# Patient Record
Sex: Male | Born: 1985 | Race: White | Hispanic: No | Marital: Single | Smoking: Current every day smoker
Health system: Southern US, Community
[De-identification: ages and names within clinical notes are randomized; demographics above are authoritative.]

## PROBLEM LIST (undated history)

## (undated) DIAGNOSIS — I10 Essential (primary) hypertension: Secondary | ICD-10-CM

## (undated) DIAGNOSIS — F419 Anxiety disorder, unspecified: Secondary | ICD-10-CM

## (undated) DIAGNOSIS — M542 Cervicalgia: Secondary | ICD-10-CM

## (undated) DIAGNOSIS — M1990 Unspecified osteoarthritis, unspecified site: Secondary | ICD-10-CM

## (undated) DIAGNOSIS — F32A Depression, unspecified: Secondary | ICD-10-CM

## (undated) DIAGNOSIS — I509 Heart failure, unspecified: Secondary | ICD-10-CM

## (undated) DIAGNOSIS — N289 Disorder of kidney and ureter, unspecified: Secondary | ICD-10-CM

## (undated) DIAGNOSIS — F329 Major depressive disorder, single episode, unspecified: Secondary | ICD-10-CM

## (undated) DIAGNOSIS — G43909 Migraine, unspecified, not intractable, without status migrainosus: Secondary | ICD-10-CM

## (undated) DIAGNOSIS — K219 Gastro-esophageal reflux disease without esophagitis: Secondary | ICD-10-CM

## (undated) HISTORY — PX: WISDOM TOOTH EXTRACTION: SHX21

## (undated) HISTORY — PX: APPENDECTOMY: SHX54

---

## 2010-11-02 ENCOUNTER — Emergency Department (HOSPITAL_COMMUNITY): Admission: EM | Admit: 2010-11-02 | Discharge: 2010-03-20 | Payer: Self-pay | Admitting: Emergency Medicine

## 2011-02-13 LAB — COMPREHENSIVE METABOLIC PANEL WITH GFR
AST: 23 U/L (ref 0–37)
Albumin: 3.9 g/dL (ref 3.5–5.2)
Alkaline Phosphatase: 70 U/L (ref 39–117)
Calcium: 8.8 mg/dL (ref 8.4–10.5)
GFR calc non Af Amer: >60 mL/min (ref >60)
Glucose, Bld: 106 mg/dL — ABNORMAL HIGH (ref 70–99)
Total Bilirubin: 0.5 mg/dL (ref 0.3–1.2)
Total Protein: 7 g/dL (ref 6.0–8.3)

## 2011-02-13 LAB — URINALYSIS, ROUTINE W REFLEX MICROSCOPIC
Bilirubin Urine: NEGATIVE
Glucose, UA: NEGATIVE mg/dL
Ketones, ur: NEGATIVE mg/dL
Leukocytes, UA: NEGATIVE
Nitrite: NEGATIVE
Protein, ur: NEGATIVE mg/dL
Specific Gravity, Urine: 1.01 (ref 1.005–1.030)
Urobilinogen, UA: 0.2 mg/dL (ref 0.0–1.0)
pH: 6 (ref 5.0–8.0)

## 2011-02-13 LAB — CBC
HCT: 44.9 % (ref 39.0–52.0)
Hemoglobin: 15.5 g/dL (ref 13.0–17.0)
MCHC: 34.5 g/dL (ref 30.0–36.0)
MCV: 91.2 fL (ref 78.0–100.0)
Platelets: 237 10*3/uL (ref 150–400)
RBC: 4.93 MIL/uL (ref 4.22–5.81)
RDW: 13 % (ref 11.5–15.5)
WBC: 8.2 K/uL (ref 4.0–10.5)

## 2011-02-13 LAB — COMPREHENSIVE METABOLIC PANEL
ALT: 24 U/L (ref 0–53)
BUN: 11 mg/dL (ref 6–23)
CO2: 24 mEq/L (ref 19–32)
Chloride: 107 mEq/L (ref 96–112)
Creatinine, Ser: 0.74 mg/dL (ref 0.4–1.5)
GFR calc Af Amer: >60 mL/min (ref >60)
Potassium: 3.3 mEq/L — ABNORMAL LOW (ref 3.5–5.1)
Sodium: 139 mEq/L (ref 135–145)

## 2011-02-13 LAB — LIPASE, BLOOD: Lipase: 22 U/L (ref 11–59)

## 2011-02-13 LAB — DIFFERENTIAL
Basophils Absolute: 0 10*3/uL (ref 0.0–0.1)
Basophils Relative: 0 % (ref 0–1)
Eosinophils Absolute: 0.2 10*3/uL (ref 0.0–0.7)
Eosinophils Relative: 3 % (ref 0–5)
Lymphocytes Relative: 32 % (ref 12–46)
Lymphs Abs: 2.6 10*3/uL (ref 0.7–4.0)
Monocytes Absolute: 0.7 10*3/uL (ref 0.1–1.0)
Monocytes Relative: 8 % (ref 3–12)
Neutro Abs: 4.7 10*3/uL (ref 1.7–7.7)
Neutrophils Relative %: 57 % (ref 43–77)

## 2011-02-13 LAB — URINE MICROSCOPIC-ADD ON

## 2011-02-13 LAB — MONONUCLEOSIS SCREEN: Mono Screen: NEGATIVE

## 2011-09-05 ENCOUNTER — Encounter (HOSPITAL_COMMUNITY): Payer: Self-pay | Admitting: Radiology

## 2011-09-05 ENCOUNTER — Inpatient Hospital Stay (HOSPITAL_COMMUNITY)
Admission: EM | Admit: 2011-09-05 | Discharge: 2011-09-05 | DRG: 343 | Disposition: A | Discharge status: Home / Self Care | Payer: Self-pay | Attending: Surgery | Admitting: Surgery

## 2011-09-05 ENCOUNTER — Other Ambulatory Visit (INDEPENDENT_AMBULATORY_CARE_PROVIDER_SITE_OTHER): Payer: Self-pay | Admitting: General Surgery

## 2011-09-05 ENCOUNTER — Emergency Department (HOSPITAL_COMMUNITY): Payer: Self-pay

## 2011-09-05 DIAGNOSIS — K3580 Unspecified acute appendicitis: Principal | ICD-10-CM | POA: Diagnosis present

## 2011-09-05 DIAGNOSIS — F17200 Nicotine dependence, unspecified, uncomplicated: Secondary | ICD-10-CM | POA: Diagnosis present

## 2011-09-05 DIAGNOSIS — R1031 Right lower quadrant pain: Secondary | ICD-10-CM

## 2011-09-05 DIAGNOSIS — R109 Unspecified abdominal pain: Secondary | ICD-10-CM | POA: Diagnosis present

## 2011-09-05 HISTORY — DX: Gastro-esophageal reflux disease without esophagitis: K21.9

## 2011-09-05 LAB — LIPASE, BLOOD: Lipase: 9 U/L — ABNORMAL LOW (ref 11–59)

## 2011-09-05 LAB — DIFFERENTIAL
Basophils Absolute: 0 K/uL (ref 0.0–0.1)
Basophils Relative: 0 % (ref 0–1)
Eosinophils Absolute: 0.1 K/uL (ref 0.0–0.7)
Eosinophils Relative: 1 % (ref 0–5)
Lymphocytes Relative: 7 % — ABNORMAL LOW (ref 12–46)
Lymphs Abs: 1.5 K/uL (ref 0.7–4.0)
Monocytes Absolute: 1.3 K/uL — ABNORMAL HIGH (ref 0.1–1.0)
Monocytes Relative: 7 % (ref 3–12)
Neutro Abs: 17.1 K/uL — ABNORMAL HIGH (ref 1.7–7.7)
Neutrophils Relative %: 85 % — ABNORMAL HIGH (ref 43–77)

## 2011-09-05 LAB — COMPREHENSIVE METABOLIC PANEL
ALT: 36 U/L (ref 0–53)
AST: 25 U/L (ref 0–37)
Albumin: 4.2 g/dL (ref 3.5–5.2)
Calcium: 10.2 mg/dL (ref 8.4–10.5)
Chloride: 102 mEq/L (ref 96–112)
Creatinine, Ser: 0.68 mg/dL (ref 0.50–1.35)
Glucose, Bld: 134 mg/dL — ABNORMAL HIGH (ref 70–99)
Potassium: 3.6 mEq/L (ref 3.5–5.1)
Total Bilirubin: 0.6 mg/dL (ref 0.3–1.2)
Total Protein: 7.6 g/dL (ref 6.0–8.3)

## 2011-09-05 LAB — CBC
HCT: 44.1 % (ref 39.0–52.0)
Hemoglobin: 16 g/dL (ref 13.0–17.0)
MCH: 31.3 pg (ref 26.0–34.0)
MCHC: 36.3 g/dL — ABNORMAL HIGH (ref 30.0–36.0)
MCV: 86.3 fL (ref 78.0–100.0)
Platelets: 257 K/uL (ref 150–400)
RBC: 5.11 MIL/uL (ref 4.22–5.81)
RDW: 12.5 % (ref 11.5–15.5)
WBC: 20 K/uL — ABNORMAL HIGH (ref 4.0–10.5)

## 2011-09-05 LAB — ETHANOL: Alcohol, Ethyl (B): <11 mg/dL (ref 0–11)

## 2011-09-12 ENCOUNTER — Encounter (INDEPENDENT_AMBULATORY_CARE_PROVIDER_SITE_OTHER): Payer: Self-pay

## 2011-09-12 NOTE — Op Note (Signed)
NAME:  Erik, [name] NO.:  [id number]  MEDICAL RECORD NO.:  [id number]  LOCATION:  WLED                         FACILITY:  [facility]  PHYSICIAN:  Erik [name], Erik [name].DATE OF BIRTH:  [birth year]  DATE OF PROCEDURE:  09/05/2011 DATE OF DISCHARGE:                              OPERATIVE REPORT   PREOPERATIVE DIAGNOSIS:  Acute appendicitis.  OPERATION:  Laparoscopic appendectomy.  ANESTHESIA:  General anesthesia, local supplementation.  SURGEON:  Erik [name], Erik [name].  ASSISTANT:  Nurse.  HISTORY:  Erik [name] is a 25 year old Caucasian male who presented to the emergency room early a.m. with the following history.  He states for the last couple of days he has not been feeling well, he stayed out of work yesterday and then last night, his wife is pregnant, they had a light dinner and then he started having pretty significant epigastric pain.  It kind of shifted to his lower abdomen.  In the early a.m., he presented to the emergency room here at [facility] and was seen by the ER physician, was definitely tender in the lower abdomen, originally kind of epigastric and they had done a chest x-ray thinking that he was complaining of so much pain in the upper abdomen, this was unremarkable. Then they did a CT, the CT showed a markedly inflamed appendix in the typical right lower quadrant location.  His white count was 20,000 and this morning approximately at 8  or 7:45, we were called after the CT was read.  On questioning, the patient stated that he has not felt well for about 2-3 days and then the pain really became more intense late yesterday afternoon and the evening.  Preoperatively, he was given 3 g of Unasyn.  He has got PAS stockings and he was taken back to the operative suite.  Induction of general anesthesia by Dr. Okey Dupre, anesthesiologist.  Foley catheter was inserted sterilely after he was intubated.  I had clipped the hair around the  umbilicus in the left lower quadrant area and then prepped him with Betadine solution and draped in a sterile manner.  A time-out was completed.  The antibiotics had been received and a small incision was made below the umbilicus.  He is a Games developer, quite muscular and the fascia I could pick up between hemostats with the underlying peritoneum was kind of pushing away and I was finally able to pull apart enough that I could take my finger and kind of go through the peritoneum.  A pursestring suture had been placed and then the Hasson cannula introduced.  He gets the inflammatory process in the right lower quadrant.  Fortunately, there was no evidence of any rupture.  A 10-11 trocar was placed in the left lower quadrant and then a 5-mm trocar was placed a little above the umbilicus on the right side.  I could then, with the camera in the left lower quadrant, kind of visualize after some light adhesions had been taken out anteriorly.  I could see the markedly inflamed appendix that was cored.  I could grab the mesentery to the appendix and then elevated this and then using the Harmonic scalpel could kind of clamp the  little vesicles and cauterize them.  This was freed so that the mesentery to the appendix had been completely freed up right down to the base of the appendix with the cecal junction.  I was working sometimes with me holding the appendix in the right upper quadrant and then kind of shift for the better exposure.  The stapler linear with a white load was then used to put across the base of the appendix cecum and after I was satisfied with the position it was fired with good hemostasis.  The appendix was then placed in the EndoCatch bag and then the bag containing the appendix withdrawn at the umbilicus.  Next, I reinserted the cannula, kind of looked at the area, washed and irrigated.  There was good hemostasis.  No evidence of any problems.  The irrigating fluid was  aspirated.  Then, since he is so muscular, I put an additional figure-of-eight in the fascia below the umbilicus and tied both it and the pursestring under direct vision looking at it through the camera.  I then removed the 5-mm port and withdrew the left lower quadrant under direct vision.  I could not actually identify the fascia and the left lower quadrant was completely stitched in the anterior fascia, I could not visualize it well enough.  The subcutaneous wounds were closed with 4-0 Monocryl and then benzoin and Steri-Strips on the skin.  I had anesthetized the fascia at the umbilicus with Marcaine and the other port sites were anesthetized as they were being placed.  The patient tolerated the procedure nicely.  I am going to give him another dose of Unasyn since his white count was 20,000 and keep him this afternoon.  He possibly can be discharged later today.     Erik [name], Erik [name].     WJW/MEDQ  D:  09/05/2011  T:  09/05/2011  Job:  [id number]  Electronically Signed by Erik [name]. on 09/12/2011 08:51:38 AM

## 2011-09-12 NOTE — H&P (Signed)
  NAME:  Erik [name], Erik [name] NO.:  [id number]  MEDICAL RECORD NO.:  [id number]  LOCATION:  1530                         FACILITY:  [facility]  PHYSICIAN:  Anselm Pancoast. Quashawn Jewkes, M.D.DATE OF BIRTH:  15-Aug-1986  DATE OF ADMISSION:  09/05/2011 DATE OF DISCHARGE:                             HISTORY & PHYSICAL   CHIEF COMPLAINT:  Abdominal pain and acute appendicitis.  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  He and his girlfriend are expecting their first child, and he stated he has not felt well for the last couple of days, not really localizing and then yesterday after evening, late they had a light meal and then he started having more severe pain originally, kind of more in the upper abdomen, but then, after he was in the ER, shifted into the lower abdomen.  The ER physicians first did a chest x-ray and lab studies.  His white count was 20,000.  The chest x-ray and abdominal films are unremarkable.  Then they obtained a CT.  He has got a markedly inflamed appendix in the right lower quadrant, a typical location and we were called at about 8:00 am to see him.  He had not received any antibiotics.  While in the ER, 3 g of Unasyn was ordered.  The patient has been a light smoker for the last year.  He works as a Games developer.  He denies any allergies, and I do not think he has had any previous surgeries.  FAMILY HISTORY:  Noncontributory.  PHYSICAL EXAMINATION:  VITAL SIGNS:  In the emergency room, he had an elevated pulse, but his blood pressure is fine at 131/99, and respirations 22. GENERAL:  He had received some pain medication and was not complaining as much pain when I visualized him.  On listening to his symptoms, it was certainly compatible with appendicitis of probably about a 48 hours total duration. CHEST:  He had good breath sounds bilaterally, was kind of a sinus tachycardia originally, but after the pain medicine given,  it was less than 100. ABDOMEN:  Definitely tender in the right lower quadrant with muscle guarding and rebound, localized to the right lower quadrant. RECTAL:  Did not do a rectal examination on him. CNS:  Physiologic. SKIN:  Unremarkable.  The patient did not have not have an elevated temperature in the emergency room that I could see.  ADMISSION IMPRESSION:  Acute appendicitis, hopefully non-ruptured.  PLAN:  We will plan with a laparoscopic appendectomy.  Note, I had done a laparoscopic appendectomy and a left ovarian cyst on the patient's wife or girlfriend within the last few years.     Anselm Pancoast. Zachery Dakins, M.D.     WJW/MEDQ  D:  09/05/2011  T:  09/05/2011  Job:  [id number]  Electronically Signed by Consuello Bossier M.D. on 09/12/2011 08:51:33 AM

## 2011-09-13 ENCOUNTER — Ambulatory Visit (INDEPENDENT_AMBULATORY_CARE_PROVIDER_SITE_OTHER): Payer: Self-pay | Admitting: General Surgery

## 2011-09-13 ENCOUNTER — Encounter (INDEPENDENT_AMBULATORY_CARE_PROVIDER_SITE_OTHER): Payer: Self-pay | Admitting: General Surgery

## 2011-09-13 VITALS — BP 132/90 | HR 60 | Temp 97.4°F | Resp 20 | Ht 73.0 in | Wt 236.5 lb

## 2011-09-13 DIAGNOSIS — K37 Unspecified appendicitis: Secondary | ICD-10-CM

## 2011-09-13 NOTE — Patient Instructions (Signed)
Return to work Monday and tried to limit lifting to approximately 40 pounds for 2 weeks. See [place] on a p.r.n. basis

## 2011-09-13 NOTE — Progress Notes (Signed)
Subjective:     Patient ID: Erik [name], male   DOB: [birth year], 25 y.o.   MRN: [id number]  HPIPatient is a moderately overweight young male who had acute appendicitis proximally 8 or 9 days ago and underwent a laparoscopic appendectomy [facility]. He was discharged the following day he states that he is continuing to improve but has not returned work. 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 Actually perform a appendectomy on his wife a couple years ago.   Review of Systems No current outpatient prescriptions on file.       Objective:   Physical ExamBP 132/90  Pulse 60  Temp 97.4 F (36.3 C)  Resp 20  Ht 6\' 1"  (1.854 m)  Wt 236 lb 8 oz (107.276 kg)  BMI 31.20 kg/m2 The patient's abdomen is soft it is Dr. well-healed appendectomy incision one below the navel one in the left lower quadrant with small in the right upper quadrant that showed no evidence of any inflammation. He had stated that he had a little bit of serous drainage from the navel several days ago but there is no redness in with his size I'm not surprised that was not more serous drainage. States his bowels are working nicely and is not having any fever. He's not required any pain medication at this time and I think it would be fine to return to work next Monday for trial left to restrict his list into approximately 40 pounds for 2 weeks.    Assessment:     Satisfactory postoperative course following acute appendicitis treated with laparoscopic appendectomy    Plan:     Return prn

## 2011-09-24 NOTE — Discharge Summary (Signed)
  NAME:  Erik, [name] NO.:  [id number]  MEDICAL RECORD NO.:  [id number]  LOCATION:  1530                         FACILITY:  [facility]  PHYSICIAN:  Anselm Pancoast. Tomeshia Pizzi, M.D.DATE OF BIRTH:  1986-01-27  DATE OF ADMISSION:  09/05/2011 DATE OF DISCHARGE:  09/05/2011                              DISCHARGE SUMMARY   ADMISSION DIAGNOSIS:  Acute appendicitis.  DISCHARGE DIAGNOSIS:  Acute appendicitis.  PROCEDURES:  Laparoscopic appendectomy, September 05, 2011, Dr. Consuello Bossier.  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  He and his girlfriend are expecting their 1st child.  He has not felt well for couple of days and eventually presented to the ER for evaluation.  His white count was 20,000.  Chest x-ray and abdominal films were unremarkable.  CT showed a markedly inflamed appendix in the right lower quadrant at typical location.  He was seen in the ER at 8 a.m.  He was treated with 3 g of IV Unasyn and taken to the OR later that morning.  For further history and physical, please see the dictated note.  HOSPITAL COURSE:  The patient was admitted and underwent laparoscopic appendectomy, September 05, 2011.  He tolerated the procedure well.  He returned to the postanesthesia care unit in satisfactory condition and then to the floor.  He is able to void.  He was extremely anxious and apparently, he takes Xanax at home for anxiety.  He received a dose of Unasyn at 2 p.m.  He was seen later that afternoon.  He calmed down and at that point, it was his opinion, he would like to go home.  Dr. Zachery Dakins agreed with plan.  He was discharged home.  He was given plain Tylenol 500 mg p.r.n. for pain and Percocet 1 to 2 q.4 h. p.r.n. for pain.  He will follow up with Dr. Zachery Dakins in 1 week and was given instructions to call.  He was instructed he may shower, liquids today, and advance diet tomorrow.  CONDITION ON DISCHARGE:   Improved.     Eber Hong, P.A.   ______________________________ Anselm Pancoast. Zachery Dakins, M.D.    WDJ/MEDQ  D:  09/16/2011  T:  09/16/2011  Job:  [id number]  Electronically Signed by Sherrie George P.A. on 09/20/2011 08:23:37 PM Electronically Signed by Consuello Bossier M.D. on 09/24/2011 11:24:18 AM

## 2012-07-01 ENCOUNTER — Emergency Department (HOSPITAL_COMMUNITY)
Admission: EM | Admit: 2012-07-01 | Discharge: 2012-07-01 | Disposition: A | Discharge status: Home / Self Care | Payer: Self-pay | Attending: Emergency Medicine | Admitting: Emergency Medicine

## 2012-07-01 ENCOUNTER — Emergency Department (HOSPITAL_COMMUNITY): Payer: Self-pay

## 2012-07-01 ENCOUNTER — Encounter (HOSPITAL_COMMUNITY): Payer: Self-pay | Admitting: *Deleted

## 2012-07-01 DIAGNOSIS — S060XAA Concussion with loss of consciousness status unknown, initial encounter: Secondary | ICD-10-CM | POA: Insufficient documentation

## 2012-07-01 DIAGNOSIS — Y93H3 Activity, building and construction: Secondary | ICD-10-CM | POA: Insufficient documentation

## 2012-07-01 DIAGNOSIS — S060X9A Concussion with loss of consciousness of unspecified duration, initial encounter: Secondary | ICD-10-CM | POA: Insufficient documentation

## 2012-07-01 DIAGNOSIS — K219 Gastro-esophageal reflux disease without esophagitis: Secondary | ICD-10-CM | POA: Insufficient documentation

## 2012-07-01 DIAGNOSIS — Z9089 Acquired absence of other organs: Secondary | ICD-10-CM | POA: Insufficient documentation

## 2012-07-01 DIAGNOSIS — S139XXA Sprain of joints and ligaments of unspecified parts of neck, initial encounter: Secondary | ICD-10-CM | POA: Insufficient documentation

## 2012-07-01 DIAGNOSIS — F17200 Nicotine dependence, unspecified, uncomplicated: Secondary | ICD-10-CM | POA: Insufficient documentation

## 2012-07-01 DIAGNOSIS — M542 Cervicalgia: Secondary | ICD-10-CM | POA: Insufficient documentation

## 2012-07-01 DIAGNOSIS — W208XXA Other cause of strike by thrown, projected or falling object, initial encounter: Secondary | ICD-10-CM | POA: Insufficient documentation

## 2012-07-01 DIAGNOSIS — S161XXA Strain of muscle, fascia and tendon at neck level, initial encounter: Secondary | ICD-10-CM

## 2012-07-01 HISTORY — DX: Cervicalgia: M54.2

## 2012-07-01 MED ORDER — OXYCODONE-ACETAMINOPHEN 5-325 MG PO TABS
2.0000 | ORAL_TABLET | Freq: Once | ORAL | Status: AC
  Administered 2012-07-01: 2 via ORAL
  Filled 2012-07-01: qty 2
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
## 2012-07-03 ENCOUNTER — Emergency Department (HOSPITAL_COMMUNITY)
Admission: EM | Admit: 2012-07-03 | Discharge: 2012-07-03 | Disposition: A | Discharge status: Home / Self Care | Payer: Self-pay | Attending: Emergency Medicine | Admitting: Emergency Medicine

## 2012-07-03 ENCOUNTER — Encounter (HOSPITAL_COMMUNITY): Payer: Self-pay

## 2012-07-03 DIAGNOSIS — F17200 Nicotine dependence, unspecified, uncomplicated: Secondary | ICD-10-CM | POA: Insufficient documentation

## 2012-07-03 DIAGNOSIS — M542 Cervicalgia: Secondary | ICD-10-CM | POA: Insufficient documentation

## 2012-07-03 DIAGNOSIS — K219 Gastro-esophageal reflux disease without esophagitis: Secondary | ICD-10-CM | POA: Insufficient documentation

## 2012-07-03 MED ORDER — OXYCODONE-ACETAMINOPHEN 5-325 MG PO TABS
2.0000 | ORAL_TABLET | Freq: Once | ORAL | Status: AC
  Administered 2012-07-03: 2 via ORAL
  Filled 2012-07-03: qty 2

## 2012-07-03 MED ORDER — OXYCODONE-ACETAMINOPHEN 5-325 MG PO TABS: 1.0000 | ORAL_TABLET | ORAL | Status: AC | PRN

## 2012-07-03 MED ORDER — DIAZEPAM 10 MG PO TABS: 10.0000 mg | ORAL_TABLET | Freq: Four times a day (QID) | ORAL | Status: AC | PRN

## 2012-07-03 MED ORDER — DIAZEPAM 5 MG PO TABS
10.0000 mg | ORAL_TABLET | Freq: Once | ORAL | Status: AC
  Administered 2012-07-03: 10 mg via ORAL
  Filled 2012-07-03: qty 2
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
## 2014-02-25 ENCOUNTER — Emergency Department (HOSPITAL_COMMUNITY)
Admission: EM | Admit: 2014-02-25 | Discharge: 2014-02-25 | Discharge status: Against Medical Advice | Payer: Medicaid Other | Attending: Emergency Medicine | Admitting: Emergency Medicine

## 2014-02-25 ENCOUNTER — Encounter (HOSPITAL_COMMUNITY): Payer: Self-pay | Admitting: Emergency Medicine

## 2014-02-25 DIAGNOSIS — F17200 Nicotine dependence, unspecified, uncomplicated: Secondary | ICD-10-CM | POA: Insufficient documentation

## 2014-02-25 DIAGNOSIS — R42 Dizziness and giddiness: Secondary | ICD-10-CM | POA: Insufficient documentation

## 2014-02-25 DIAGNOSIS — Z8679 Personal history of other diseases of the circulatory system: Secondary | ICD-10-CM | POA: Insufficient documentation

## 2014-02-25 DIAGNOSIS — M542 Cervicalgia: Secondary | ICD-10-CM | POA: Insufficient documentation

## 2014-02-25 DIAGNOSIS — K089 Disorder of teeth and supporting structures, unspecified: Secondary | ICD-10-CM | POA: Insufficient documentation

## 2014-02-25 DIAGNOSIS — R6889 Other general symptoms and signs: Secondary | ICD-10-CM | POA: Insufficient documentation

## 2014-02-25 DIAGNOSIS — R51 Headache: Secondary | ICD-10-CM | POA: Insufficient documentation

## 2014-02-25 DIAGNOSIS — M2550 Pain in unspecified joint: Secondary | ICD-10-CM | POA: Insufficient documentation

## 2014-02-25 DIAGNOSIS — R3915 Urgency of urination: Secondary | ICD-10-CM | POA: Insufficient documentation

## 2014-02-25 DIAGNOSIS — IMO0002 Reserved for concepts with insufficient information to code with codable children: Secondary | ICD-10-CM | POA: Insufficient documentation

## 2014-02-25 DIAGNOSIS — H538 Other visual disturbances: Secondary | ICD-10-CM | POA: Insufficient documentation

## 2014-02-25 DIAGNOSIS — M545 Low back pain, unspecified: Secondary | ICD-10-CM | POA: Insufficient documentation

## 2014-02-25 DIAGNOSIS — R209 Unspecified disturbances of skin sensation: Secondary | ICD-10-CM | POA: Insufficient documentation

## 2014-02-25 DIAGNOSIS — Z8739 Personal history of other diseases of the musculoskeletal system and connective tissue: Secondary | ICD-10-CM | POA: Insufficient documentation

## 2014-02-25 DIAGNOSIS — F411 Generalized anxiety disorder: Secondary | ICD-10-CM | POA: Insufficient documentation

## 2014-02-25 DIAGNOSIS — Z792 Long term (current) use of antibiotics: Secondary | ICD-10-CM | POA: Insufficient documentation

## 2014-02-25 DIAGNOSIS — Z79899 Other long term (current) drug therapy: Secondary | ICD-10-CM | POA: Insufficient documentation

## 2014-02-25 HISTORY — DX: Migraine, unspecified, not intractable, without status migrainosus: G43.909

## 2014-02-25 LAB — CBG MONITORING, ED: GLUCOSE-CAPILLARY: 103 mg/dL — AB (ref 70–99)

## 2014-02-25 NOTE — ED Notes (Signed)
Pt requesting directions to the exit door, pt advised he can not leave facility premises while a patient of the ED. Pt verbalized understanding and states "I understand and i am checking myself out, where is the door?" [place] [facility].

## 2014-02-25 NOTE — ED Provider Notes (Signed)
CSN: [id number]     Arrival date & time 02/25/14  2138 History   First MD Initiated Contact with Patient 02/25/14 2242     Chief Complaint  Patient presents with  . multiple complaints     (Consider location/radiation/quality/duration/timing/severity/associated sxs/prior Treatment) The history is provided by the patient, medical records and the spouse. No language interpreter was used.    Branndon Tuite is a 28 y.o. male  with a hx of GERD, neck pain and migraine presents to the Emergency Department with multiple complaints.    Pt asks his family to report his problems.  She reports he is c/o polyarthralgias (knees, hips, shoulders, etc). He has been seen by multiple MDs in [place] without a diagnosis, but he admits to a hx of arthritis.  He reports self medicating with Roxicet off the street since last fall.  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  Pt's family reports he uses the drugs because of his anxiety. 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  Pt reports he has been clean 2 weeks, but wife denies this.   He reports the pain in his neck causes bad migraines only relieved with Soma and Percocet 67m.  He reports he has a "lump" in his neck which "pops."  He reports his shoulders "knot up" when he is having the headaches. Pt has been seeing a psychiatrist who Rx cymbalta.  Pt also reports taking suboxone for pain control.  Pt reports he had his wisdom teeth removed more than 1 year ago and he reports he had a dental infection that has spread throughout his body in the last year. Pt is under the care of a dentist in [place] who Rx clindamycin who told him "the infection had spread into his brain and into his chest."  Pt reports he has blurry vision only when his teeth hurt.  Pt saw this dentist 5 days ago.     Past Medical History  Diagnosis Date  . GERD (gastroesophageal reflux disease)   . Neck pain   . Migraine    Past Surgical History  Procedure Laterality Date  . Appendectomy     09/05/2011  . Wisdom tooth extraction     History reviewed. No pertinent family history. History  Substance Use Topics  . Smoking status: Current Every Day Smoker -- 1.00 packs/day  . Smokeless tobacco: Not on file  . Alcohol Use: Yes    Review of Systems  Constitutional: Positive for chills (with hot flashes). Negative for fever, diaphoresis, appetite change, fatigue and unexpected weight change.  HENT: Positive for dental problem. Negative for mouth sores.   Eyes: Positive for visual disturbance.  Respiratory: Negative for cough, chest tightness, shortness of breath and wheezing.   Cardiovascular: Negative for chest pain.  Gastrointestinal: Negative for nausea, vomiting, abdominal pain, diarrhea and constipation.  Endocrine: Positive for cold intolerance and heat intolerance. Negative for polydipsia, polyphagia and polyuria.  Genitourinary: Positive for urgency. Negative for dysuria, frequency and hematuria.  Musculoskeletal: Positive for arthralgias (hip), back pain (low back) and neck pain. Negative for neck stiffness.  Skin: Negative for rash.  Allergic/Immunologic: Negative for immunocompromised state.  Neurological: Positive for dizziness, numbness and headaches. Negative for syncope and light-headedness.  Hematological: Does not bruise/bleed easily.  Psychiatric/Behavioral: Negative for sleep disturbance. The patient is nervous/anxious.       Allergies  Codeine; Levetiracetam; Neurontin; Red dye; and Vicodin  Home Medications   Current Outpatient Rx  Name  Route  Sig  Dispense  Refill  . clindamycin (CLEOCIN) 300 MG capsule   Oral   Take 300 mg by mouth 3 (three) times daily.         . DULoxetine (CYMBALTA) 30 MG capsule   Oral   Take 30 mg by mouth 2 (two) times daily.          BP 135/88  Pulse 101  Temp(Src) 98.3 F (36.8 C) (Oral)  Resp 16  SpO2 100% Physical Exam  Nursing note and vitals reviewed. Constitutional: He appears well-developed and  well-nourished. No distress.  Awake, alert, nontoxic appearance  HENT:  Head: Normocephalic and atraumatic.  Mouth/Throat: No oropharyngeal exudate.  Eyes: Conjunctivae are normal. No scleral icterus.  Neck: Normal range of motion.  Pulmonary/Chest: Effort normal.  Musculoskeletal: Normal range of motion.  Neurological: He is alert.  Speech is clear and goal oriented Moves extremities without ataxia  Skin: He is not diaphoretic.  First tract marks visible on the left antecubital fossa and the posterior left hand  Psychiatric: His mood appears anxious. He is agitated. He expresses no homicidal and no suicidal ideation. He expresses no suicidal plans and no homicidal plans.    ED Course  Procedures (including critical care time) Labs Review Labs Reviewed  CBG MONITORING, ED - Abnormal; Notable for the following:    Glucose-Capillary 103 (*)    All other components within normal limits   Imaging Review No results found.   EKG Interpretation None      MDM   Final diagnoses:  Arthralgia of multiple joints   Prashant Glosser presents with a myriad of complaints for which he wants answers.   He denies heroin use and reports he has been clean for 2 weeks.  Fresh track marks noted along the patient's left arm.  I discussed with him and his wife at length and we will be happy to assess and evaluate him here in the emergency department however I do not believe that we will be able to answer all his questions here. I also discussed my concern that some of his symptoms may be related to his heroine use and withdrawal symptoms if they generally occur in the mornings.  At this point patient became very angry stating that he's been clean for 2 weeks and that he's not in withdrawal.  He reports he no longer wants to be evaluated and leaves the room AMA.    I was able to gather a history and perform a no touch physical however I was unable to fully assess the patient or perform any lab work.   Patient vitals were stable on arrival, he was alert and oriented. He appears to have both a capacity and the competency to make this decision however he left before I was able to fully discuss the risk factors beyond death with him.  I spent some time after he left discussing the situation with his wife. She was given resources to find a primary care physician and to find detox facilities for him.    Date: 02/26/2014 Patient: Tesean Stump Admitted: 02/25/2014  9:39 PM Attending Provider: Dorie Rank, MD  Queen Slough or his authorized caregiver has made the decision for the patient to leave the emergency department against the advice of [facility], PA-C.  He or his authorized caregiver has been informed and understands the inherent risks, including death.  He or his authorized caregiver has decided to accept the responsibility for this decision. Queen Slough and all necessary parties have been advised that  he may return for further evaluation or treatment. His condition at time of discharge appeared Stable.  Queen Slough had current vital signs as follows:  Blood pressure 135/88, pulse 101, temperature 98.3 F (36.8 C), temperature source Oral, resp. rate 16, SpO2 100.00%.   Queen Slough or his authorized caregiver has not signed the Leaving Against Medical Advice form prior to leaving the department.  Aala Ransom, Jarrett Soho 02/26/2014      Abigail Butts, PA-C 02/26/14 1586

## 2014-02-25 NOTE — Discharge Instructions (Signed)
°Emergency Department Resource Guide °1) Find a Doctor and Pay Out of Pocket °Although you won't have to find out who is covered by your insurance plan, it is a good idea to ask around and get recommendations. You will then need to call the office and see if the doctor you have chosen will accept you as a new patient and what types of options they offer for patients who are self-pay. Some doctors offer discounts or will set up payment plans for their patients who do not have insurance, but you will need to ask so you aren't surprised when you get to your appointment. ° °2) Contact Your Local Health Department °Not all health departments have doctors that can see patients for sick visits, but many do, so it is worth a call to see if yours does. If you don't know where your local health department is, you can check in your phone book. The CDC also has a tool to help you locate your state's health department, and many state websites also have listings of all of their local health departments. ° °3) Find a Walk-in Clinic °If your illness is not likely to be very severe or complicated, you may want to try a walk in clinic. These are popping up all over the country in pharmacies, drugstores, and shopping centers. They're usually staffed by nurse practitioners or physician assistants that have been trained to treat common illnesses and complaints. They're usually fairly quick and inexpensive. However, if you have serious medical issues or chronic medical problems, these are probably not your best option. ° °No Primary Care Doctor: °- Call Health Connect at  832-8000 - they can help you locate a primary care doctor that  accepts your insurance, provides certain services, etc. °- Physician Referral Service- 1-800-533-3463 ° °Chronic Pain Problems: °Organization         Address  Phone   Notes  °[facility]  (336) 297-2271 Patients need to be referred by their primary care doctor.  ° °Medication  Assistance: °Organization         Address  Phone   Notes  °[place] [facility] [address] °[place], [place] [address] (336) 641-8030 --Must be a resident of [place] °-- Must have NO insurance coverage whatsoever (no Medicaid/ Medicare, etc.) °-- The pt. MUST have a primary care doctor that directs their care regularly and follows them in the community °  °[facility]  (866) 331-1348   °[employer]  (888) 892-1162   ° °Agencies that provide inexpensive medical care: °Organization         Address  Phone   Notes  °[facility]  (336) 832-8035   °[facility]    (336) 832-7272   °[facility] [address] °[place], [place] [address] (336) 832-4777   °[facility] [place] [address], °[place] (336) 271-4999   °[facility]    (336) 373-0678   °[facility]    (336) 272-1050   °[facility] ° [address], [place] Phone:  (336) 832-4444, Fax:  (336) 832-4440 Hours of Operation:  9 am - 6 pm, M-F.  Also accepts Medicaid/Medicare and self-pay.  °[facility] ° [address], [place] Phone: (336) 832-3150, Fax: (336) 832-3151. Hours of Operation:  8:30 am - 5:30 pm, M-F.  Also accepts Medicaid and self-pay.  °[facility] [place] [address], [place] Phone: (336) 878-6027   °[facility] [address], [place], [place] (336)723-1848, Ext. 123 Mondays & Thursdays: 7-9 AM.  First 15 patients are seen on a first come, first serve basis. °  ° °Medicaid-accepting [place] Providers: ° °Organization         Address  Phone   Notes  °[facility] [address], [place] (336) 641-2100 Also accepts self-pay patients.  °[facility] [address], [place] ° (336) 856-9996   °[facility] [address], [place]  (336) 288-8857   °[facility] [address] [place] [address], [place] (336) 299-7000   °Veita Bland [address], [place]  ° (336) 373-1557 Only accepts [place] Access Medicaid patients after they have their name applied to their card.  ° °Self-Pay (no insurance) in [place]: ° °Organization         Address  Phone   Notes  °Sickle Cell Patients, [facility] [address], [place] (336) 832-1970   °[facility] [address], [place] (336) 832-4400   °[facility] ° [address], [place] (336) 992-4800   °[facility]/Dr. Osei-Bonsu ° [address] [place] [address], [place] or [address], [place] (336) 841-8500 Phone number for both [place] and [place] locations is the same.  °[facility] [address], [place] (336) 299-0000   °[facility] [address] [place] [address], [place] or [address] (336) 852-7530 °(336) 878-2260   °[facility] [address], [place] (336) 350-1642, phone; (336) 294-5005, fax Sees patients 1st and 3rd Saturday of every month.  Must not qualify for public or private insurance (i.e. Medicaid, Medicare, [place] Health Choice, Veterans' Benefits) • Household income should be no more than 200% of the poverty level •The clinic cannot treat you if you are pregnant or think you are pregnant • Sexually transmitted diseases are not treated at the clinic.  ° ° °Dental Care: °Organization         Address  Phone  Notes  °[place] [facility] [address], [place] (336) 641-6152 Accepts children up to age 21 who are enrolled in Medicaid or [place] Health Choice; pregnant women with a Medicaid card; and children who have applied for Medicaid or [place] Health Choice, but were declined, whose parents can pay a reduced fee at time of service.  °[place]  [facility] [place]  [address], [place] (336) 641-7733 Accepts children up to age 21 who are enrolled in Medicaid or [place] Health Choice; pregnant women with a Medicaid card; and children who have applied for Medicaid or [place] Health Choice, but were declined, whose parents can pay a reduced fee at time of service.  °[facility] ° [address], [place] (336) 641-4533 Patients are seen by appointment only. Walk-ins are not accepted. [facility] will see patients 18 years of age and older. °Monday - Tuesday (8am-5pm) °Most Wednesdays (8:30-5pm) °$30 per visit, cash only  °[facility] ° [address], [place] (336) 641-4533 Patients are seen by appointment only. Walk-ins are not accepted. [facility] will see patients 18 years of age and older. °One   Wednesday Evening (Monthly: Volunteer Based).  $30 per visit, cash only  °[facility]  (919) 537-3737 for adults; Children under age 4, call Graduate Pediatric Dentistry at ([phone]) [phone]. Children aged 4-14, please call (919) [phone] to request a pediatric application. ° Dental services are provided in all areas of dental care including fillings, crowns and bridges, complete and partial dentures, implants, gum treatment, root canals, and extractions. Preventive care is also provided. Treatment is provided to both adults and children. °Patients are selected via a lottery and there is often a waiting list. °  °[facility] [address], °[place] ° (336) 763-8833 [email or web address] °  °[facility] [address], [place], [place] (336)723-1848, Ext. 123 Second and Fourth Thursday of each month, opens at 6:30 AM; Clinic ends at 9 AM.  Patients are seen on a first-come first-served basis, and a limited number are seen during each clinic.  ° °[facility] ° [address], [place], [place] (336) 723-7904    Eligibility Requirements °You must have lived in [place], [place], or [place] counties for at least the last three months. °  You cannot be eligible for state or federal sponsored healthcare insurance, including Veterans Administration, Medicaid, or Medicare. °  You generally cannot be eligible for healthcare insurance through your employer.  °  How to apply: °Eligibility screenings are held every Tuesday and Wednesday afternoon from 1:00 pm until 4:00 pm. You do not need an appointment for the interview!  °[facility] [address], [place], [place] 336-631-2330   °[facility]  336-342-8273   °[place]  336-703-3100   °[facility]  336-570-6415   ° °Behavioral Health Resources in the Community: °Intensive Outpatient Programs °Organization         Address  Phone  Notes  °[facility] [address], [place], [place] 336-878-6098   °[facility] [address], [place], [place] 336-832-9800   °[facility] [address], [place], [place] ° 336-882-2125   °[facility] [address],  °[place], [place] 1-800-853-5163 or 336-641-4981   °Substance Abuse Resources °Organization         Address  Phone  Notes  °[facility]  336-882-2125   °[facility]  336-784-9470   °[facility]  336-285-9073   °[facility]  336-845-3988   °Residential & Outpatient Substance Abuse Program  1-800-659-3381   °Psychological Services °Organization         Address  Phone  Notes  °[facility]  336- 832-9600   °[facility]  336- 378-7881   °[facility] [address], [place] 1-800-853-5163 or 336-641-4981   ° °Mobile Crisis Teams °Organization         Address  Phone  Notes  °[facility], Mobile Crisis Care Unit  1-877-626-1772   °Assertive °[facility] ° [address]  [place], [place] 336-834-9664   °Sharon DeEsch [address] °[place] 336-554-5454   ° °Self-Help/Support Groups °Organization         Address  Phone             Notes  °[facility]ental Health Assoc. of  - variety of support groups  336- 373-1402 Call for more information  °Narcotics Anonymous (NA), [facility] [address], °[place]  2 meetings at this location  ° °  Residential Treatment Programs Organization         Address  Phone  Notes  [facility] [address],    [place]  [phone]   [facility]  [address], [place] [id number], [place], [place] [phone]   [facility] [address], [place] [phone] Admissions: 8am-3pm M-F  [facility] [address]    [place], [place] [phone](760)[phone]   [facility] [address], [place], [place] [phone]) [phone]   [facility] [address]  [place], [place] [phone]   [facility] - Intensive Outpatient [address], [place], [place] [phone]   [facility] ([facility] [address]  [place], [place] [phone] or [phone]([phone]   [facility] ([facility]) [address] [place], [place] [phone] Accepts Medicaid  [facility] [address]  [place] [phone] Substance Abuse/Addiction Treatment   [facility] Resources Organization         Address  Phone  Notes  [facility]  (817)847-9019(888) [phone]   Angie FavaJulie Brannon, PhD [address], [address] [place], [place]   [phone](336) [phone] or [phone](336) ([phone]   [facility]   [address] [place], [place] [phone](336) [phone]   [facility] [address], [place], [place] [phone](336) ([phone] Insurance/Medicaid/sponsorship through [facility] [address]                                    [place], [place] [phone](336) ([phone] Therapy/tele-psych/case    [facility] [address]   [place], [place] [phone](336) ([phone]    Dr. Lolly MustacheArfeen  [phone](336) [phone]   [facility]  [employer] [facility] 1) [address], [place] 2) [address], [place] 3)  [address], [place] [phone](336) [phone](336) [phone]  [phone](336) [phone]   [facility] Child Abuse Hotline 718-711-0346(336) 412-812-3887 or (567)[phone](336) [phone] (After Hours)           Behavioral Health Resources in the [facility]  Intensive Outpatient Programs: [facility]      [address] [place], [place] [phone] Both a day and evening program       [facility]     [address]        [place], [place] [phone]         [facility] [address] [place] [phone]  [facility] ACCESS LINE: [phone] or [phone] [address] [place], [place] [phone] [email or web address]  Mobile Crisis Teams:                                        [facility]         Mobile Crisis Care Unit [phone]             [facility] [address] [place] [phone]                                         [facility] [address]haron DeEsch [address], [place]  [address] [place] [phone]  Self-Help/Support Groups: Mental Health Assoc. of [employer] of support groups 786-742-4321 (call for more info)  Narcotics Anonymous (NA) [facility] [address] [place] - 2 meetings at this location  Residential Treatment Programs:  [facility]      [address]        [place]       [phone]         [facility] [address] [place] [address] [place], [place]  [address] [phone]  [facility]  [address] [place], [place] [address] [phone] Admissions: 8am-3pm M-F  [facility]     [address]        [place], [place] [address]       [phone]         [facility] [address] [place], [place] [phone]  [facility] [address] [place], [place] [phone]  [facility] - Intensive Outpatient      [address]     [place], [place]       [phone]         [facility] ([facility]     [address] [place], [place] [phone] or [phone])[phone]  [facility] ([facility])  [address] [place], [place] [phone]  [facility] [address]                                               [address] [place] [phone]  [facility] Resources: [phone]               General Therapy                                                Angie Fava, PhD        [address] [place], [place] [address]         [phone]   Insurance  [facility]   [address] [place], [place] [address] [phone]  [facility] [address] [place], [place] [address] [phone] Insurance/Medicaid/sponsorship through [facility]                                              [address]                                        [place], [place] [address]    Therapy/tele-psych/case         [phone]          [facility] [address], [place]  [address]  Adolescent/group home/case management [phone]  Rosette Reveal PhD       General therapy       Insurance   [id number]         Dr. Adele Schilder Insurance [phone] M-F  [facility] Medicaid, sponsorship [phone]

## 2014-02-25 NOTE — ED Notes (Signed)
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

## 2014-02-27 NOTE — ED Provider Notes (Signed)
Medical screening examination/treatment/procedure(s) were performed by non-physician practitioner and as supervising physician I was immediately available for consultation/collaboration.    Vannessa [place] R Shaarav Ripple, MD 02/27/14 2122 

## 2014-03-04 ENCOUNTER — Emergency Department (HOSPITAL_COMMUNITY)
Admission: EM | Admit: 2014-03-04 | Discharge: 2014-03-04 | Disposition: A | Discharge status: Home / Self Care | Payer: 59 | Attending: Emergency Medicine | Admitting: Emergency Medicine

## 2014-03-04 ENCOUNTER — Encounter (HOSPITAL_COMMUNITY): Payer: Self-pay | Admitting: Emergency Medicine

## 2014-03-04 DIAGNOSIS — K219 Gastro-esophageal reflux disease without esophagitis: Secondary | ICD-10-CM | POA: Insufficient documentation

## 2014-03-04 DIAGNOSIS — F1110 Opioid abuse, uncomplicated: Secondary | ICD-10-CM | POA: Insufficient documentation

## 2014-03-04 DIAGNOSIS — Z79899 Other long term (current) drug therapy: Secondary | ICD-10-CM | POA: Insufficient documentation

## 2014-03-04 DIAGNOSIS — F17200 Nicotine dependence, unspecified, uncomplicated: Secondary | ICD-10-CM | POA: Insufficient documentation

## 2014-03-04 DIAGNOSIS — N179 Acute kidney failure, unspecified: Secondary | ICD-10-CM

## 2014-03-04 HISTORY — DX: Unspecified osteoarthritis, unspecified site: M19.90

## 2014-03-04 LAB — CBC
HCT: 41.1 % (ref 39.0–52.0)
Hemoglobin: 14.5 g/dL (ref 13.0–17.0)
MCH: 30.5 pg (ref 26.0–34.0)
MCHC: 35.3 g/dL (ref 30.0–36.0)
MCV: 86.3 fL (ref 78.0–100.0)
PLATELETS: 245 10*3/uL (ref 150–400)
RBC: 4.76 MIL/uL (ref 4.22–5.81)
RDW: 13.4 % (ref 11.5–15.5)
WBC: 9.1 10*3/uL (ref 4.0–10.5)

## 2014-03-04 LAB — COMPREHENSIVE METABOLIC PANEL
ALT: 19 U/L (ref 0–53)
AST: 19 U/L (ref 0–37)
Albumin: 3.7 g/dL (ref 3.5–5.2)
Alkaline Phosphatase: 88 U/L (ref 39–117)
BUN: 16 mg/dL (ref 6–23)
CALCIUM: 9.2 mg/dL (ref 8.4–10.5)
CO2: 23 meq/L (ref 19–32)
CREATININE: 1.36 mg/dL — AB (ref 0.50–1.35)
Chloride: 100 mEq/L (ref 96–112)
GFR, EST AFRICAN AMERICAN: 83 mL/min — AB (ref >90)
GFR, EST NON AFRICAN AMERICAN: 71 mL/min — AB (ref >90)
GLUCOSE: 109 mg/dL — AB (ref 70–99)
Potassium: 3.9 mEq/L (ref 3.7–5.3)
SODIUM: 139 meq/L (ref 137–147)
Total Bilirubin: <0.2 mg/dL — ABNORMAL LOW (ref 0.3–1.2)
Total Protein: 7.5 g/dL (ref 6.0–8.3)

## 2014-03-04 LAB — RAPID URINE DRUG SCREEN, HOSP PERFORMED
Amphetamines: NOT DETECTED
Barbiturates: NOT DETECTED
Benzodiazepines: POSITIVE — AB
COCAINE: NOT DETECTED
OPIATES: POSITIVE — AB
Tetrahydrocannabinol: NOT DETECTED

## 2014-03-04 LAB — URINALYSIS, ROUTINE W REFLEX MICROSCOPIC
Bilirubin Urine: NEGATIVE
Glucose, UA: NEGATIVE mg/dL
Hgb urine dipstick: NEGATIVE
Ketones, ur: NEGATIVE mg/dL
LEUKOCYTES UA: NEGATIVE
Nitrite: NEGATIVE
Protein, ur: NEGATIVE mg/dL
SPECIFIC GRAVITY, URINE: 1.027 (ref 1.005–1.030)
UROBILINOGEN UA: 1 mg/dL (ref 0.0–1.0)
pH: 6.5 (ref 5.0–8.0)

## 2014-03-04 LAB — SALICYLATE LEVEL

## 2014-03-04 LAB — ETHANOL: Alcohol, Ethyl (B): <11 mg/dL (ref 0–11)

## 2014-03-04 LAB — ACETAMINOPHEN LEVEL: Acetaminophen (Tylenol), Serum: <15 ug/mL (ref 10–30)

## 2014-03-04 NOTE — Discharge Instructions (Signed)
Your kidney functions slightly elevated creatinine of 1.36. Please continue to drink plenty of fluids in the next week and have this rechecked by your doctor.   Opiate Dependence The above names are all different names used for opiates. Opiates are any medication made from the poppy plant. It is a medication which produces a calming, sleepy effect. Because achieving this effect requires more and more of this drug to get the same result, opiates become addictive. A family history of addiction or an addictive personality increases the risk. When drug use is interfering with normal living activities, it has become abuse. This includes problems with family, friends, and your job. Psychological dependence has developed when your mind tells you that the drug is needed. This emotional dependence is the craving for the "high" that some drugs cause. Emotional addicts always want this high instead of the way they are feeling when not using the drug. This is difficult to overcome. This is usually followed by physical dependence that has developed when continuing increases of drug are required to get the same feeling or "high". This is known as addiction or chemical dependency.  SIGNS OF CHEMICAL DEPENDENCY  Friends and family tell you there is a problem.  Fighting when using drugs.  Mood swings and insomnia.  Forgetfulness.  Not remembering what you do while using (blackouts).  Feeling sick from using drugs but you continue using.  Lie about use or amounts of drugs (chemicals) used.  Need chemicals to get you going.  Suffer in work International aid/development worker or school because of drug use.  Need drugs to relate to people or feel comfortable in social situations.  Use drugs to forget problems.  Difficulty with attention.  Neglecting obligations. If you answered "yes" to any or some of the above signs of chemical dependency, you may have a problem. The longer the use of drugs continues, the greater the problems  will become. Do not experiment with drugs.  SIGNS AND SYMPTOMS OF PHYSICAL DEPENDENCE  Sudden stopping of the narcotic is uncomfortable when tolerance has developed. Physical problems will develop. This is called withdrawal.  How bad the withdrawal is varies from person to person. Some of the smaller problems are:  Tremors in the hands or shakes and jitters.  A fast heart rate and rapid breathing.  An increase in temperature.  Anxiety and panic attacks with bad dreams.  Muscle aches and pains. You may have more serious problems. These can include:  Feeling sick to your stomach or throwing up.  Dehydration develops if you cannot keep fluids down.  Tremors and chills or fever with sweating and anxiety.  Hallucinations and cravings.  Body aching with restlessness and insomnia.  Seizures or convulsions. These problems can last for months. These uncomfortable feeling can cause you to use drugs again just to feel better. OTHER HEALTH RISKS OF NARCOTICS USE INCLUDE:  The increased possibility of getting AIDS, hepatitis, other infectious or sexually transmitted diseases.  Unplanned pregnancy and having a baby born addicted to narcotics. You then put your baby through painful withdrawal symptoms including: shaking, jerking, and crying in pain. Many babies die. Other babies have lifelong disabilities and learning problems. TREATMENT  Effective treatment and management of narcotic addiction requires a multi-faceted, team approach that includes:  Medications to minimize the symptoms of narcotic withdrawal.  Medications to reduce the need for continued narcotic use.  Medical management of unrelated medical problems.  Pain management.  Social services.  Psychological treatment.  Behavioral therapies. Stopping your dependence  is hard but may save your life. If you continue using drugs, the only possible outcome is loss of self respect and esteem, violence, and eventually prison or  death. To stop abuse, you must first realize you have a problem. You control your behavior. Once you realize this, commit to quitting. Addiction is a disease. You need medical help to get well. Your caregiver can counsel you or refer you for counseling. The best way to do this is to seek out an organization for help. These include Alcoholics Anonymous, Narcotics Anonymous, or the [employer] on Alcoholism and Drug Dependence. HOW TO STAY CLEAN WHEN YOU HAVE QUIT USING  Develop healthy activities.  Form friendships with those who do not use drugs.  Stay away from all drugs. Alcohol will lessen your ability to say no.  Have ready excuses available about why you cannot use. If that is difficult, stay away from people who knew you used. HOME CARE INSTRUCTIONS   Both prescription medicines and over-the-counter medicines are used as part of the treatment. It is critical to follow the recommendations of your caregiver at home. Any additional over-the-counter medications or changes in the recommended treatment plan should be discussed with your caregiver first.  It is important to keep fluids down. Juices, soda, Gatorade, or a mixture will help prevent dehydration.  Be prepared for the emotional swings of quitting.  Call your local emergency services if seizures (convulsions) occur or if you are unable to keep liquids down.  Keep a written record of medications you take and times given. Overcoming addictions takes years. Over time you will have a lessening of the craving for narcotics. Talk to your caregiver or a member of your support group if you need more help.  Addiction cannot be cured but it can be stopped. Treatment centers are listed in the yellow pages under: Cocaine, Narcotics, and Alcoholics Anonymous. Most hospitals and clinics can refer you to a specialized care center. Document Released: 09/09/2007 Document Revised: 02/04/2012 Document Reviewed: 09/09/2007 [facility] Patient  Information 2014 [place], [place].  Heroin Abuse and Withdrawal Heroin is an illegal opiate (narcotic) drug. It is very addictive. Once the effects of the drug are felt, the need to use the drug again (known as craving) is strong. When the drug is suddenly stopped after using it on a regular basis, significant and painful physical withdrawal symptoms are experienced.  EFFECTS OF HEROIN USE AND ABUSE The drug produces a profound feeling of pleasure and relaxation (euphoria) that lasts for a short time. This is followed by sleepiness and then agitation with craving for more drugs. After a short time of regular use (days or weeks), dependence on the drug is developed. This means the user will become ill without it (withdrawal) and needs to keep using the drug to function. This is how fast heroin abuse becomes physical dependence and addiction (chemical dependency). EFFECTS ON THE BRAIN Heroin is addictive because it activates regions of the brain that are responsible for producing both the pleasurable sensation of "reward" and physical dependence. Together, these actions account for the user's loss of control and the rapid development of drug dependence. HOW IT IS USED  Heroin is a drug that is mostly taken by a shot with a needle (injection) in the vein. Using a needle can have harmful effects on the user. Often times, the needle is unclean (unsterile), the heroin is contaminated with cutting agents, or heroin is used in combination with other drugs such as alcohol or cocaine.  In recent years, the purity of street heroin has increased a lot allowing users to "snort" the drug into their noses where it is absorbed through the lining of the nasal passages. You can become addicted this way. Many IV heroin users report they started by snorting. RELATED PROBLEMS  Needle sharing can cause AIDS. The AIDS virus is carried in contaminated blood left in the needle, syringe or other drug-related equipment. When  unclean needles are used, the AIDS virus is injected into the new user. There is no cure or proven vaccine for AIDS to prevent it.  Heroin use during pregnancy is associated with stillbirths. It can also increase the chance for miscarriage. Babies born addicted to heroin must undergo withdrawal after birth. These babies show a number of developmental problems.  Heroin use can cause serious health problems such as liver infection (hepatitis), skin abscesses, vein inflammation and heart disease. SYMPTOMS The signs and symptoms of heroin use include:  Euphoria.  Drowsiness.  Respiratory depression (which can progress until breathing stops).  Small (constricted) pupils and nausea.  Need for increasingly higher doses of the drug to get the same effect.  Weight loss.  "Track" marks (scars) on arms and legs.  Emotional instability. Symptoms of a heroin overdose include:   Shallow breathing.  Pinpoint pupils.  Clammy skin.  Convulsion.  Coma. WITHDRAWAL EFFECTS Withdrawal symptoms include:  Watery eyes.  Runny nose.  Yawning.  Loss of appetite.  Tremors.  Panic.  Chills.  Sweating.  Nausea.  Muscle cramps.  Not being able to sleep well (insomnia).  Depression and mood swings.  Elevations in blood pressure, pulse, respiratory rate and temperature. RISK OF OVERDOSE Because the drug is produced illegally, heroin users risk taking an unusually potent dose (due to differences in purity). This can lead to overdose, coma and possible death. Especially dangerous is the situation where a user has been "clean" (abstinent) from using the drug for a period of time and then relapses. Fatal overdoses can occur because the user fails to account for the change in the body's tolerance to the drug. TREATMENT  There are many treatment options for heroin addiction. They include medications as well as behavioral therapies. Research has demonstrated that many people can  successfully stop using heroin when medication is combined with other supportive services. Patients can return to stable and productive lives, living completely free of all narcotics. Some of the medications and other treatment approaches used are:  Methadone. This is a medication that blocks the effects of heroin for about 24 hours. It has a proven record of success when prescribed at a high enough dosage level for people addicted to heroin.  Naloxone may be used to treat cases of overdose, as well as naltrexone. Both of these block the effects of morphine, heroin and other opiates. People who have become free of narcotics often take naltrexone to avoid relapse.  Buprenorphine is now available for treating addiction to heroin and other opiates. This medication offers less risk of addiction. It can be dispensed in the privacy of a doctor's office.  Several other medications for use in heroin treatment programs are also under study.  There are many effective behavioral treatments available for heroin addiction. These can include residential and outpatient approaches. 12-step programs, such as Narcotics Anonymous, have also proven to be effective. HOME CARE INSTRUCTIONS  Some individuals can successfully stop using heroin at home with medically assisted detoxification. This requires following your caregiver's instructions very carefully. This may include use of  some of the following medications and therapies:  Stomach medicine can be used for the nausea.  Imodium can be used for the diarrhea.  Benadryl can be used for the sleeplessness and restlessness.  Anxiety medication such as Xanax or Ativan. These must be administered exactly as prescribed, usually with the help of a home caregiver.  Avoiding dehydration by drinking more fluids than usual. While water alone is fine, any non-alcoholic fluid is okay (soup, juice, etc.).  Providing quiet, calm support and cooling or warmth as needed.  Call  your local emergency services (911 in [place] if seizures occur or if the patient is unable to keep liquids down.  Keep a written record of medications given and times given. Addiction cannot be cured but it can be treated successfully.   Treatment centers are listed in the yellow pages under:  Substance Abuse.  Narcotics Anonymous.  Drug Abuse Counseling.  Most hospitals and clinics can refer you to a specialized care center.  The [place] government maintains a toll-free number for obtaining treatment referrals:1-9107792729 or (708)142-11821-251-051-7501 (TDD) and maintains a website: http://findtreatment.Rock[place].plsamhsa.gov.  Other websites for additional information are:  www.mentalhealth.[place].plsamhsa.gov.  GreatestFeeling.tnwww.nida.gov.  In [place] treatment resources are listed in each [place] with listings available under:  [facility] or similar titles. Document Released: 11/15/2003 Document Revised: 02/04/2012 Document Reviewed: 10/29/2008 [facility] Patient Information 2014 [place], [place].

## 2014-03-04 NOTE — ED Notes (Signed)
Pt here for detox from heroin. Pt states last had heroin IV early this morning. [facility] [place], was told they have a bed ready and waiting for him but he needs to be medically cleared first.

## 2014-03-04 NOTE — ED Provider Notes (Signed)
TIME SEEN: 9:00 PM  CHIEF COMPLAINT: Heroin detox  HPI: Pt is a 28 y.o. 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 Patient has contacted [facility] [place] he states they have a bed for the patient but he needs to be medically cleared first. He denies any current medical complaints. No chest pain or shortness breath, vomiting or diarrhea, dysuria hematuria, numbness or weakness, headache.  ROS: See HPI Constitutional: no fever  Eyes: no drainage  ENT: no runny nose   Cardiovascular:  no chest pain  Resp: no SOB  GI: no vomiting GU: no dysuria Integumentary: no rash  Allergy: no hives  Musculoskeletal: no leg swelling  Neurological: no slurred speech ROS otherwise negative  PAST MEDICAL HISTORY/PAST SURGICAL HISTORY:  Past Medical History  Diagnosis Date  . GERD (gastroesophageal reflux disease)   . Neck pain   . Migraine   . Arthritis     MEDICATIONS:  Prior to Admission medications   Medication Sig Start Date End Date Taking? Authorizing Provider  amoxicillin (AMOXIL) 500 MG capsule Take 500 mg by mouth 4 (four) times daily - after meals and at bedtime.   Yes Historical Provider, MD  DULoxetine (CYMBALTA) 30 MG capsule Take 30 mg by mouth 2 (two) times daily.   Yes Historical Provider, MD  promethazine (PHENERGAN) 25 MG tablet Take 25 mg by mouth every 6 (six) hours as needed for nausea or vomiting.   Yes Historical Provider, MD    ALLERGIES:  Allergies  Allergen Reactions  . Codeine Nausea And Vomiting  . Levetiracetam Other (See Comments)    "said he became severely ill and angry mood swings" MD stopped medication  . Neurontin [Gabapentin] Other (See Comments)    "Feels out of it"  . Red Dye     Red dye #3  . Vicodin [Hydrocodone-Acetaminophen] Itching and Rash    SOCIAL HISTORY:  History  Substance Use Topics  . Smoking status:  Current Every Day Smoker -- 1.00 packs/day  . Smokeless tobacco: Not on file  . Alcohol Use: Yes     Comment: rarely    FAMILY HISTORY: No family history on file.  EXAM: BP 136/75  Pulse 114  Temp(Src) 99.3 F (37.4 C) (Oral)  Resp 16  Ht 6' (1.829 m)  Wt 240 lb (108.863 kg)  BMI 32.54 kg/m2  SpO2 100% CONSTITUTIONAL: Alert and oriented and responds appropriately to questions. Well-appearing; well-nourished HEAD: Normocephalic EYES: Conjunctivae clear, PERRL ENT: normal nose; no rhinorrhea; moist mucous membranes; pharynx without lesions noted NECK: Supple, no meningismus, no LAD  CARD: Regular and tachycardic; S1 and S2 appreciated; no murmurs, no clicks, no rubs, no gallops RESP: Normal chest excursion without splinting or tachypnea; breath sounds clear and equal bilaterally; no wheezes, no rhonchi, no rales,  ABD/GI: Normal bowel sounds; non-distended; soft, non-tender, no rebound, no guarding BACK:  The back appears normal and is non-tender to palpation, there is no CVA tenderness EXT: Normal ROM in all joints; non-tender to palpation; no edema; normal capillary refill; no cyanosis    SKIN: Normal color for age and race; warm; multiple track marks in his bilateral decubital fossa with no sign of induration or erythema or tenderness or warmth or fluctuance; 2+ radial pulses bilaterally NEURO: Moves all extremities equally PSYCH: The patient's mood and manner are appropriate. Grooming and personal hygiene are appropriate. No SI, HI, hallucinations  MEDICAL DECISION MAKING: Patient here requesting detox from heroin. He has no current medical complaints. We'll obtain screening labs and urine. Patient's wife is also asking for [place] to check him for STDs which patient agrees to. I discussed with patient and wife that we will not have these results tonight. He states he is not concerned that he has been exposed any sexual transmitted disease and does not feel he needs treatment at this  time.  ED PROGRESS: Patient's labs are unremarkable other than a mild elevation of his creatinine of 1.36. Have discussed with patient I recommend treating plenty of fluids and having his PCP check this in one week. His ureter is also positive for opiates and benzodiazepines. Discussed with [facility] [place] [phone] greetings with the patient. Will fax lab results. Discussed with patient that he'll need to arrange his own transportation. He is safe to be discharged home. No safety concerns.     Layla Maw Imir Brumbach, DO 03/04/14 2326

## 2014-03-05 LAB — HIV ANTIBODY (ROUTINE TESTING W REFLEX): HIV 1&2 Ab, 4th Generation: NONREACTIVE

## 2014-03-05 LAB — GC/CHLAMYDIA PROBE AMP
CT PROBE, AMP APTIMA: NEGATIVE
GC PROBE AMP APTIMA: NEGATIVE

## 2014-03-05 LAB — RPR

## 2015-01-11 ENCOUNTER — Emergency Department (HOSPITAL_COMMUNITY): Admission: EM | Admit: 2015-01-11 | Discharge: 2015-01-11 | Discharge status: Absent without leave | Payer: 59

## 2015-02-27 ENCOUNTER — Inpatient Hospital Stay (HOSPITAL_COMMUNITY)
Admission: EM | Admit: 2015-02-27 | Discharge: 2015-02-28 | DRG: 603 | Discharge status: Against Medical Advice | Payer: Medicaid Other | Attending: Internal Medicine | Admitting: Internal Medicine

## 2015-02-27 ENCOUNTER — Encounter (HOSPITAL_COMMUNITY): Payer: Self-pay | Admitting: Emergency Medicine

## 2015-02-27 DIAGNOSIS — M1990 Unspecified osteoarthritis, unspecified site: Secondary | ICD-10-CM | POA: Diagnosis present

## 2015-02-27 DIAGNOSIS — L0390 Cellulitis, unspecified: Secondary | ICD-10-CM | POA: Diagnosis present

## 2015-02-27 DIAGNOSIS — L03116 Cellulitis of left lower limb: Principal | ICD-10-CM | POA: Diagnosis present

## 2015-02-27 DIAGNOSIS — L03119 Cellulitis of unspecified part of limb: Secondary | ICD-10-CM

## 2015-02-27 DIAGNOSIS — K219 Gastro-esophageal reflux disease without esophagitis: Secondary | ICD-10-CM | POA: Diagnosis present

## 2015-02-27 DIAGNOSIS — F1110 Opioid abuse, uncomplicated: Secondary | ICD-10-CM | POA: Diagnosis present

## 2015-02-27 DIAGNOSIS — F1910 Other psychoactive substance abuse, uncomplicated: Secondary | ICD-10-CM | POA: Diagnosis present

## 2015-02-27 DIAGNOSIS — Z79899 Other long term (current) drug therapy: Secondary | ICD-10-CM

## 2015-02-27 DIAGNOSIS — F17210 Nicotine dependence, cigarettes, uncomplicated: Secondary | ICD-10-CM | POA: Diagnosis present

## 2015-02-27 DIAGNOSIS — L03114 Cellulitis of left upper limb: Secondary | ICD-10-CM

## 2015-02-27 DIAGNOSIS — L02619 Cutaneous abscess of unspecified foot: Secondary | ICD-10-CM | POA: Diagnosis present

## 2015-02-27 LAB — I-STAT CG4 LACTIC ACID, ED: Lactic Acid, Venous: 1.2 mmol/L (ref 0.5–2.0)

## 2015-02-27 MED ORDER — SODIUM CHLORIDE 0.9 % IV BOLUS (SEPSIS)
1000.0000 mL | INTRAVENOUS | Status: AC
  Administered 2015-02-28 (×2): 1000 mL via INTRAVENOUS

## 2015-02-27 MED ORDER — VANCOMYCIN HCL IN DEXTROSE 1-5 GM/200ML-% IV SOLN
1000.0000 mg | Freq: Once | INTRAVENOUS | Status: DC
  Filled 2015-02-27: qty 200

## 2015-02-27 MED ORDER — PIPERACILLIN-TAZOBACTAM 3.375 G IVPB 30 MIN
3.3750 g | Freq: Once | INTRAVENOUS | Status: AC
  Administered 2015-02-28: 3.375 g via INTRAVENOUS
  Filled 2015-02-27: qty 50

## 2015-02-27 NOTE — Progress Notes (Addendum)
ANTIBIOTIC CONSULT NOTE - INITIAL  Pharmacy Consult for Vancomycin and Zosyn Indication: cellulitis  Allergies  Allergen Reactions  . Codeine Nausea And Vomiting  . Levetiracetam Other (See Comments)    "said he became severely ill and angry mood swings" MD stopped medication  . Neurontin [Gabapentin] Other (See Comments)    "Feels out of it"  . Red Dye     Red dye #3  . Vicodin [Hydrocodone-Acetaminophen] Itching and Rash    Patient Measurements: Height: 6' (182.9 cm) Weight: 220 lb (99.791 kg) IBW/kg (Calculated) : 77.6 Adjusted Body Weight:   Vital Signs: Temp: 99.1 F (37.3 C) (04/03 2145) Temp Source: Oral (04/03 2145) BP: 135/72 mmHg (04/03 2345) Pulse Rate: 108 (04/03 2345) Intake/Output from previous day:   Intake/Output from this shift:    Labs: No results for input(s): WBC, HGB, PLT, LABCREA, CREATININE in the last 72 hours. CrCl cannot be calculated (Patient has no serum creatinine result on file.). No results for input(s): VANCOTROUGH, VANCOPEAK, VANCORANDOM, GENTTROUGH, GENTPEAK, GENTRANDOM, TOBRATROUGH, TOBRAPEAK, TOBRARND, AMIKACINPEAK, AMIKACINTROU, AMIKACIN in the last 72 hours.   Microbiology: No results found for this or any previous visit (from the past 720 hour(s)).  Medical History: Past Medical History  Diagnosis Date  . GERD (gastroesophageal reflux disease)   . Neck pain   . Migraine   . Arthritis     Medications:   (Not in a hospital admission) Scheduled:   Infusions:  . piperacillin-tazobactam    . sodium chloride    . vancomycin     Assessment: 29yo male IVDU presents with cellulitis on RUE and R foot for last week. Pharmacy is consulted to dose vancomycin and zosyn for cellulitis. Pt is low-grade febrile to 99.1, WBC 15, LA wnl, sCr 0.9.   Pt received Zosyn 3.375g IV once in ED.  Goal of Therapy:  Vancomycin trough level 10-15 mcg/ml  Plan:  Vancomycin 1250mg  IV q12h Zosyn 3.375g IV q8h Measure antibiotic drug levels  at steady state Follow up culture results, renal function, and clinical course  Arlean HoppingCorey M. Newman PiesBall, PharmD Clinical Pharmacist Pager [phone] 02/28/2015,12:00 AM  ADDN: Pharmacy is consulted to dose vancomycin and zosyn for sepsis.   Plan: Continue zosyn 3.375g IV q8h Change vancomycin to 1g IV q8h  Arlean Hoppingorey M. Newman PiesBall, PharmD Clinical Pharmacist Pager [phone]

## 2015-02-27 NOTE — ED Notes (Signed)
Patient states I can't take pain medicine anyway cause i took some goody powders and my stomach is messed up.

## 2015-02-27 NOTE — ED Notes (Signed)
Spoke to Dr. Karma GanjaLinker in regards to patient's red inflammed skin that circles arm, awaiting Dr. Wilkie AyeHorton.

## 2015-02-27 NOTE — ED Notes (Signed)
To ED with c/o cellulitis in right upper and right foot-- both are red/hot/swollen-- [facility]-- [facility] and [facility]-- pt sleepy-- denies using any IV drugs today-- admits to taking multiple pain pills (oxycodone).

## 2015-02-27 NOTE — ED Notes (Signed)
Dr. Horton at the bedside.  

## 2015-02-27 NOTE — ED Provider Notes (Addendum)
CSN: [id number]     Arrival date & time 02/27/15  2117 History  This chart was scribed for Shon Batonourtney F Vendetta Pittinger, MD by Tanda RockersMargaux Venter, ED Scribe. This patient was seen in room A11C/A11C and the patient's care was started at 11:50 PM.    Chief Complaint  Patient presents with  . Cellulitis   The history is provided by the patient. No language interpreter was used.     HPI Comments: Erik [name] is a 29 y.o. male with hx of IV drug use who presents to the Emergency Department complaining of cellulitis in left upper extremity and left foot that began 1 week ago. Pt was at [facility] 2 weeks ago and reports being stuck multiple times to get blood. Pt began having the cellulitis shortly afterwards. He went to [facility] night and was referred to [facility] but refused and drove himself to the ED here instead. Pt reports increased swelling since last night. He also complains of a fever, headache, and nausea. Pt's highest fever was 104 degrees. He reports that he has been taking a lot of BC powder, which caused abdominal pain. He denies any other symptoms.   Past Medical History  Diagnosis Date  . GERD (gastroesophageal reflux disease)   . Neck pain   . Migraine   . Arthritis    Past Surgical History  Procedure Laterality Date  . Appendectomy      09/05/2011  . Wisdom tooth extraction     No family history on file. History  Substance Use Topics  . Smoking status: Current Every Day Smoker -- 1.00 packs/day  . Smokeless tobacco: Not on file  . Alcohol Use: Yes     Comment: rarely    Review of Systems  Constitutional: Positive for fever and chills.  Respiratory: Negative.  Negative for chest tightness and shortness of breath.   Cardiovascular: Negative.  Negative for chest pain.  Gastrointestinal: Positive for nausea and abdominal pain. Negative for vomiting and diarrhea.  Genitourinary: Negative.  Negative for dysuria.  Musculoskeletal: Negative for back  pain.  Skin: Positive for color change. Negative for rash.  Neurological: Negative for headaches.  All other systems reviewed and are negative.     Allergies  Codeine; Levetiracetam; Neurontin; Red dye; and Vicodin  Home Medications   Prior to Admission medications   Medication Sig Start Date End Date Taking? Authorizing Provider  amoxicillin (AMOXIL) 500 MG capsule Take 500 mg by mouth 4 (four) times daily - after meals and at bedtime.    Historical Provider, MD  DULoxetine (CYMBALTA) 30 MG capsule Take 30 mg by mouth 2 (two) times daily.    Historical Provider, MD  promethazine (PHENERGAN) 25 MG tablet Take 25 mg by mouth every 6 (six) hours as needed for nausea or vomiting.    Historical Provider, MD   Triage Vitals: BP 124/64 mmHg  Pulse 105  Temp(Src) 99.1 F (37.3 C) (Oral)  Resp 20  Ht 6' (1.829 m)  Wt 220 lb (99.791 kg)  BMI 29.83 kg/m2  SpO2 96%   Physical Exam  Constitutional: He is oriented to person, place, and time. No distress.  HENT:  Head: Normocephalic and atraumatic.  Mouth/Throat: Oropharynx is clear and moist.  Eyes: Pupils are equal, round, and reactive to light.  Cardiovascular: Regular rhythm and normal heart sounds.   No murmur heard. tachycardia  Pulmonary/Chest: Effort normal and breath sounds normal. No respiratory distress. He has no wheezes.  Abdominal: Soft.  Bowel sounds are normal. There is no tenderness. There is no rebound.  Musculoskeletal: He exhibits edema.  Neurological: He is alert and oriented to person, place, and time.  Skin:  Extensive cellulitis noted of the left upper extremity extending from the proximal arm towards the elbow, tense swelling noted with warmth, no induration  Cellulitis over the dorsum of the left foot extending towards the ankle, fluctuance noted with purulence suspicious for abscess  Psychiatric: He has a normal mood and affect.  Nursing note and vitals reviewed.   ED Course  INCISION AND  DRAINAGE Date/Time: 02/28/2015 2:46 AM Performed by: Shon Baton Authorized by: Shon Baton Consent: Verbal consent obtained. Risks and benefits: risks, benefits and alternatives were discussed Consent given by: patient Type: abscess Body area: lower extremity Location details: left foot Anesthesia method: none. Scalpel size: 11 Incision type: single straight Complexity: simple Drainage: purulent Drainage amount: moderate Wound treatment: wound left open Packing material: none Patient tolerance: Patient tolerated the procedure well with no immediate complications   (including critical care time) Angiocath insertion Performed by: Shon Baton  Consent: Verbal consent obtained. Risks and benefits: risks, benefits and alternatives were discussed Time out: Immediately prior to procedure a "time out" was called to verify the correct patient, procedure, equipment, support staff and site/side marked as required.  Preparation: Patient was prepped and draped in the usual sterile fashion.  Vein Location: right basilic  Ultrasound Guided  Gauge: 20  Normal blood return and flush without difficulty Patient tolerance: Patient tolerated the procedure well with no immediate complications.    DIAGNOSTIC STUDIES: Oxygen Saturation is 96% on RA, normal by my interpretation.    COORDINATION OF CARE: 11:55 AM-Discussed treatment plan which includes Blood culture, CBC, CMP, Lactic acid, UA, Urine culture with pt at bedside and pt agreed to plan.   Labs Review Labs Reviewed  CBC WITH DIFFERENTIAL/PLATELET - Abnormal; Notable for the following:    WBC 15.0 (*)    Neutro Abs 10.9 (*)    Monocytes Absolute 1.4 (*)    All other components within normal limits  COMPREHENSIVE METABOLIC PANEL - Abnormal; Notable for the following:    Sodium 134 (*)    Albumin 3.4 (*)    All other components within normal limits  CULTURE, BLOOD (ROUTINE X 2)  CULTURE, BLOOD (ROUTINE X  2)  URINE CULTURE  URINALYSIS, ROUTINE W REFLEX MICROSCOPIC  URINE RAPID DRUG SCREEN (HOSP PERFORMED)  I-STAT CG4 LACTIC ACID, ED  I-STAT CG4 LACTIC ACID, ED  I-STAT CG4 LACTIC ACID, ED    Imaging Review No results found.   EKG Interpretation None      MDM   Final diagnoses:  Cellulitis of left upper extremity  Cellulitis and abscess of foot   Patient presents with extensive cellulitis of the left arm and left foot. Nontoxic on exam. Vital signs notable for tachycardia. Patient is currently afebrile. Blood cultures obtained and patient given vancomycin and Zosyn per protocol. Patient also given fluids and pain medication. Abscess left lower extremity was indeed at the bedside. Patient has leukocytosis to 15. Lactate is normal. He is otherwise hemodynamically stable. Will admit for IV antibiotics.  I personally performed the services described in this documentation, which was scribed in my presence. The recorded information has been reviewed and is accurate.      Shon Baton, MD 02/28/15 0454  Shon Baton, MD 03/01/15 1455

## 2015-02-28 DIAGNOSIS — L03119 Cellulitis of unspecified part of limb: Secondary | ICD-10-CM | POA: Diagnosis not present

## 2015-02-28 DIAGNOSIS — K219 Gastro-esophageal reflux disease without esophagitis: Secondary | ICD-10-CM | POA: Diagnosis present

## 2015-02-28 DIAGNOSIS — F1110 Opioid abuse, uncomplicated: Secondary | ICD-10-CM | POA: Diagnosis present

## 2015-02-28 DIAGNOSIS — L02619 Cutaneous abscess of unspecified foot: Secondary | ICD-10-CM | POA: Diagnosis not present

## 2015-02-28 DIAGNOSIS — L0390 Cellulitis, unspecified: Secondary | ICD-10-CM | POA: Diagnosis present

## 2015-02-28 DIAGNOSIS — F17210 Nicotine dependence, cigarettes, uncomplicated: Secondary | ICD-10-CM | POA: Diagnosis present

## 2015-02-28 DIAGNOSIS — F1910 Other psychoactive substance abuse, uncomplicated: Secondary | ICD-10-CM | POA: Diagnosis present

## 2015-02-28 DIAGNOSIS — L03116 Cellulitis of left lower limb: Secondary | ICD-10-CM | POA: Diagnosis not present

## 2015-02-28 DIAGNOSIS — Z79899 Other long term (current) drug therapy: Secondary | ICD-10-CM | POA: Diagnosis not present

## 2015-02-28 DIAGNOSIS — L03114 Cellulitis of left upper limb: Secondary | ICD-10-CM | POA: Diagnosis not present

## 2015-02-28 DIAGNOSIS — M1990 Unspecified osteoarthritis, unspecified site: Secondary | ICD-10-CM | POA: Diagnosis present

## 2015-02-28 LAB — CBC
HEMATOCRIT: 36 % — AB (ref 39.0–52.0)
Hemoglobin: 12.2 g/dL — ABNORMAL LOW (ref 13.0–17.0)
MCH: 30.4 pg (ref 26.0–34.0)
MCHC: 33.9 g/dL (ref 30.0–36.0)
MCV: 89.8 fL (ref 78.0–100.0)
PLATELETS: 228 10*3/uL (ref 150–400)
RBC: 4.01 MIL/uL — AB (ref 4.22–5.81)
RDW: 12.6 % (ref 11.5–15.5)
WBC: 11.4 10*3/uL — AB (ref 4.0–10.5)

## 2015-02-28 LAB — URINALYSIS, ROUTINE W REFLEX MICROSCOPIC
Bilirubin Urine: NEGATIVE
Glucose, UA: NEGATIVE mg/dL
Hgb urine dipstick: NEGATIVE
Ketones, ur: NEGATIVE mg/dL
LEUKOCYTES UA: NEGATIVE
Nitrite: NEGATIVE
PH: 6 (ref 5.0–8.0)
Protein, ur: NEGATIVE mg/dL
Specific Gravity, Urine: 1.012 (ref 1.005–1.030)
Urobilinogen, UA: 0.2 mg/dL (ref 0.0–1.0)

## 2015-02-28 LAB — CBC WITH DIFFERENTIAL/PLATELET
BASOS ABS: 0 10*3/uL (ref 0.0–0.1)
BASOS PCT: 0 % (ref 0–1)
EOS PCT: 1 % (ref 0–5)
Eosinophils Absolute: 0.2 10*3/uL (ref 0.0–0.7)
HEMATOCRIT: 39.4 % (ref 39.0–52.0)
Hemoglobin: 13.3 g/dL (ref 13.0–17.0)
Lymphocytes Relative: 16 % (ref 12–46)
Lymphs Abs: 2.5 10*3/uL (ref 0.7–4.0)
MCH: 30.2 pg (ref 26.0–34.0)
MCHC: 33.8 g/dL (ref 30.0–36.0)
MCV: 89.5 fL (ref 78.0–100.0)
Monocytes Absolute: 1.4 10*3/uL — ABNORMAL HIGH (ref 0.1–1.0)
Monocytes Relative: 9 % (ref 3–12)
NEUTROS ABS: 10.9 10*3/uL — AB (ref 1.7–7.7)
NEUTROS PCT: 74 % (ref 43–77)
Platelets: 286 10*3/uL (ref 150–400)
RBC: 4.4 MIL/uL (ref 4.22–5.81)
RDW: 12.6 % (ref 11.5–15.5)
WBC: 15 10*3/uL — ABNORMAL HIGH (ref 4.0–10.5)

## 2015-02-28 LAB — COMPREHENSIVE METABOLIC PANEL
ALBUMIN: 3.4 g/dL — AB (ref 3.5–5.2)
ALT: 13 U/L (ref 0–53)
AST: 19 U/L (ref 0–37)
Alkaline Phosphatase: 81 U/L (ref 39–117)
Anion gap: 13 (ref 5–15)
BUN: 15 mg/dL (ref 6–23)
CHLORIDE: 97 mmol/L (ref 96–112)
CO2: 24 mmol/L (ref 19–32)
CREATININE: 0.89 mg/dL (ref 0.50–1.35)
Calcium: 8.9 mg/dL (ref 8.4–10.5)
GFR calc Af Amer: >90 mL/min (ref >90)
GFR calc non Af Amer: >90 mL/min (ref >90)
Glucose, Bld: 86 mg/dL (ref 70–99)
Potassium: 4.2 mmol/L (ref 3.5–5.1)
Sodium: 134 mmol/L — ABNORMAL LOW (ref 135–145)
Total Bilirubin: 0.4 mg/dL (ref 0.3–1.2)
Total Protein: 7.4 g/dL (ref 6.0–8.3)

## 2015-02-28 LAB — RAPID URINE DRUG SCREEN, HOSP PERFORMED
AMPHETAMINES: NOT DETECTED
BENZODIAZEPINES: NOT DETECTED
Barbiturates: NOT DETECTED
Cocaine: NOT DETECTED
Opiates: POSITIVE — AB
TETRAHYDROCANNABINOL: NOT DETECTED

## 2015-02-28 LAB — BASIC METABOLIC PANEL
Anion gap: 9 (ref 5–15)
BUN: 11 mg/dL (ref 6–23)
CO2: 23 mmol/L (ref 19–32)
CREATININE: 0.88 mg/dL (ref 0.50–1.35)
Calcium: 8.2 mg/dL — ABNORMAL LOW (ref 8.4–10.5)
Chloride: 105 mmol/L (ref 96–112)
GFR calc Af Amer: >90 mL/min (ref >90)
GLUCOSE: 136 mg/dL — AB (ref 70–99)
Potassium: 4 mmol/L (ref 3.5–5.1)
SODIUM: 137 mmol/L (ref 135–145)

## 2015-02-28 LAB — I-STAT CG4 LACTIC ACID, ED: Lactic Acid, Venous: 1.64 mmol/L (ref 0.5–2.0)

## 2015-02-28 MED ORDER — VANCOMYCIN HCL 10 G IV SOLR
1250.0000 mg | Freq: Two times a day (BID) | INTRAVENOUS | Status: DC
  Administered 2015-02-28: 1250 mg via INTRAVENOUS
  Filled 2015-02-28: qty 1250

## 2015-02-28 MED ORDER — ONDANSETRON HCL 4 MG PO TABS: 4.0000 mg | ORAL_TABLET | Freq: Four times a day (QID) | ORAL | Status: DC | PRN

## 2015-02-28 MED ORDER — ENOXAPARIN SODIUM 40 MG/0.4ML ~~LOC~~ SOLN
40.0000 mg | SUBCUTANEOUS | Status: DC
  Administered 2015-02-28: 40 mg via SUBCUTANEOUS
  Filled 2015-02-28: qty 0.4

## 2015-02-28 MED ORDER — ALUM & MAG HYDROXIDE-SIMETH 200-200-20 MG/5ML PO SUSP: 30.0000 mL | Freq: Four times a day (QID) | ORAL | Status: DC | PRN

## 2015-02-28 MED ORDER — ONDANSETRON HCL 4 MG/2ML IJ SOLN: 4.0000 mg | Freq: Four times a day (QID) | INTRAMUSCULAR | Status: DC | PRN

## 2015-02-28 MED ORDER — HYDROMORPHONE HCL 1 MG/ML IJ SOLN
2.0000 mg | Freq: Once | INTRAMUSCULAR | Status: AC
  Administered 2015-02-28: 2 mg via INTRAVENOUS
  Filled 2015-02-28: qty 2

## 2015-02-28 MED ORDER — PIPERACILLIN-TAZOBACTAM 3.375 G IVPB: 3.3750 g | Freq: Three times a day (TID) | INTRAVENOUS | Status: DC

## 2015-02-28 MED ORDER — PIPERACILLIN-TAZOBACTAM 3.375 G IVPB
3.3750 g | Freq: Three times a day (TID) | INTRAVENOUS | Status: DC
  Administered 2015-02-28: 3.375 g via INTRAVENOUS
  Filled 2015-02-28 (×4): qty 50

## 2015-02-28 MED ORDER — ACETAMINOPHEN 650 MG RE SUPP
650.0000 mg | Freq: Four times a day (QID) | RECTAL | Status: DC | PRN
Start: 2015-02-28 — End: 2015-02-28

## 2015-02-28 MED ORDER — ONDANSETRON HCL 4 MG/2ML IJ SOLN
4.0000 mg | Freq: Once | INTRAMUSCULAR | Status: AC
  Administered 2015-02-28: 4 mg via INTRAVENOUS
  Filled 2015-02-28: qty 2

## 2015-02-28 MED ORDER — NICOTINE 14 MG/24HR TD PT24
14.0000 mg | MEDICATED_PATCH | TRANSDERMAL | Status: DC
  Administered 2015-02-28: 14 mg via TRANSDERMAL
  Filled 2015-02-28: qty 1

## 2015-02-28 MED ORDER — PANTOPRAZOLE SODIUM 40 MG PO TBEC
40.0000 mg | DELAYED_RELEASE_TABLET | Freq: Every day | ORAL | Status: DC
  Administered 2015-02-28: 40 mg via ORAL
  Filled 2015-02-28: qty 1

## 2015-02-28 MED ORDER — DULOXETINE HCL 30 MG PO CPEP
30.0000 mg | ORAL_CAPSULE | Freq: Two times a day (BID) | ORAL | Status: DC
  Administered 2015-02-28: 30 mg via ORAL
  Filled 2015-02-28 (×2): qty 1

## 2015-02-28 MED ORDER — SODIUM CHLORIDE 0.9 % IV SOLN
INTRAVENOUS | Status: DC
  Administered 2015-02-28 (×2): via INTRAVENOUS

## 2015-02-28 MED ORDER — HYDROMORPHONE HCL 1 MG/ML IJ SOLN
1.0000 mg | INTRAMUSCULAR | Status: DC | PRN
Start: 2015-02-28 — End: 2015-02-28
  Administered 2015-02-28 (×2): 2 mg via INTRAVENOUS
  Filled 2015-02-28 (×2): qty 2

## 2015-02-28 MED ORDER — VANCOMYCIN HCL IN DEXTROSE 1-5 GM/200ML-% IV SOLN
1000.0000 mg | Freq: Three times a day (TID) | INTRAVENOUS | Status: DC
  Administered 2015-02-28: 1000 mg via INTRAVENOUS
  Filled 2015-02-28 (×4): qty 200

## 2015-02-28 MED ORDER — HYDROMORPHONE HCL 1 MG/ML IJ SOLN
1.0000 mg | Freq: Once | INTRAMUSCULAR | Status: AC
  Administered 2015-02-28: 1 mg via INTRAVENOUS
  Filled 2015-02-28: qty 1

## 2015-02-28 MED ORDER — OXYCODONE HCL 5 MG PO TABS
5.0000 mg | ORAL_TABLET | ORAL | Status: DC | PRN
  Administered 2015-02-28: 5 mg via ORAL
  Filled 2015-02-28: qty 1

## 2015-02-28 MED ORDER — ACETAMINOPHEN 325 MG PO TABS: 650.0000 mg | ORAL_TABLET | Freq: Four times a day (QID) | ORAL | Status: DC | PRN

## 2015-02-28 NOTE — ED Notes (Signed)
Dr. Wilkie AyeHorton at the bedside with ultrasound for IV access attempt.

## 2015-02-28 NOTE — ED Notes (Signed)
Explained to patient that currently Dr. Wilkie AyeHorton orders NPO.

## 2015-02-28 NOTE — Progress Notes (Signed)
Patient admitted after midnight.  Please see H&P.   Sepsis/Cellulitis- of Left Arm Blood and urine Cultures sent IV Vancomycin and Zosyn IVFs -pulses intact, patient says ER doc did bedside US with no abscess seen in the upper arm   Abscess of foot I+D by EDP Dr. Wilkie AyeHorton- does not appear culture was sent after the I&D IV Abxs in #1.    IV drug abuse UDS sent  Tobacco Use Disorder Nicotine Patch  Leukocytosis -decreasing  Erik CanaryJessica Britten [name]

## 2015-02-28 NOTE — ED Notes (Signed)
Dr. Wilkie AyeHorton completed i& d on left foot. Dry dressing applied

## 2015-02-28 NOTE — Progress Notes (Signed)
Pt says he needs to leave because of family issues. Informed him that he was jeopardizing his health by leaving AMA. Dr. Benjamine MolaVann notified. Pt signed AMA papers and left the unit.

## 2015-02-28 NOTE — H&P (Signed)
[facility] Admission History and Physical       Erik [name] Dorning [id number] DOB: 1986/06/23 DOA: 02/27/2015  Referring physician: EDP PCP: Default, Provider, MD  Specialists:   Chief Complaint: Increased Pain and Redness of Left Arm, and Left Foot  HPI: Erik [name] Bednarczyk is a 29 y.o. male with a history of IV Drug Abuse (Heroin) who presents to the ED with complaints of 1 week of fevers and chills and progressive redness, swelling and pain of his left arm, and left foot.  He believes that the skin infections were due to multiple blood draw attempts that were done when he was in the [facility] 2 weeks ago.  He also reports last using Heroin 2 weeks ago.   He reports having temperature to 104 at home.   He went to [facility] last [facility] and was to be referred to [facility] for [facility] but he did not want to wait, so [facility] .     Review of Systems:  Constitutional: No Weight Loss, No Weight Gain, Night Sweats, +Fevers, +Chills, Dizziness, Light Headedness, Fatigue, or Generalized Weakness HEENT: No Headaches, Difficulty Swallowing,Tooth/Dental Problems,Sore Throat,  No Sneezing, Rhinitis, Ear Ache, Nasal Congestion, or Post Nasal Drip,  Cardio-vascular:  No Chest pain, Orthopnea, PND, Edema in Lower Extremities, Anasarca, Dizziness, Palpitations  Resp: No Dyspnea, No DOE, No Cough, No Hemoptysis, No Wheezing.    GI: No Heartburn, Indigestion, Abdominal Pain, Nausea, Vomiting, Diarrhea, Constipation, Hematemesis, Hematochezia, Melena, Change in Bowel Habits,  Loss of Appetite  GU: No Dysuria, No Change in Color of Urine, No Urgency or Urinary Frequency, No Flank pain.  Musculoskeletal: No Joint Pain or Swelling, No Decreased Range of Motion, No Back Pain.  Neurologic: No Syncope, No Seizures, Muscle Weakness, Paresthesia, Vision Disturbance or Loss, No Diplopia, No Vertigo, No Difficulty Walking,  Skin: +Redness Left Arm and  Left Foot . Psych: No Change in Mood or Affect, No Depression or Anxiety, No Memory loss, No Confusion, or Hallucinations   Past Medical History  Diagnosis Date  . GERD (gastroesophageal reflux disease)   . Neck pain   . Migraine   . Arthritis      Past Surgical History  Procedure Laterality Date  . Appendectomy      09/05/2011  . Wisdom tooth extraction        Prior to Admission medications   Medication Sig Start Date End Date Taking? Authorizing Provider  amoxicillin (AMOXIL) 500 MG capsule Take 500 mg by mouth 4 (four) times daily - after meals and at bedtime.    Historical Provider, MD  DULoxetine (CYMBALTA) 30 MG capsule Take 30 mg by mouth 2 (two) times daily.    Historical Provider, MD  promethazine (PHENERGAN) 25 MG tablet Take 25 mg by mouth every 6 (six) hours as needed for nausea or vomiting.    Historical Provider, MD     Allergies  Allergen Reactions  . Codeine Nausea And Vomiting  . Levetiracetam Other (See Comments)    "said he became severely ill and angry mood swings" MD stopped medication  . Neurontin [Gabapentin] Other (See Comments)    "Feels out of it"  . Red Dye     Red dye #3  . Vicodin [Hydrocodone-Acetaminophen] Itching and Rash    Social History:    reports that he has been smoking.  He does not have any smokeless tobacco history on file. He reports that he drinks alcohol. He reports  that he uses illicit drugs (IV).     No family history on file.     Physical Exam:  GEN:  Obese 29 y.o. Caucasian male examined and in no acute distress; cooperative with exam Filed Vitals:   02/28/15 0115 02/28/15 0145 02/28/15 0215 02/28/15 0230  BP: 146/65 149/85 148/79 140/72  Pulse: 105 104 106 101  Temp:      TempSrc:      Resp: Height:      Weight:      SpO2: 99% 97% 98% 100%   Blood pressure 140/72, pulse 101, temperature 99.1 F (37.3 C), temperature source Oral, resp. rate 15, height 6' (1.829 m), weight 94.901 kg (209 lb 3.5  oz), SpO2 100 %. PSYCH: He is alert and oriented x4; does not appear anxious does not appear depressed; affect is normal HEENT: Normocephalic and Atraumatic, Mucous membranes pink; PERRLA; EOM intact; Fundi:  Benign;  No scleral icterus, Nares: Patent, Oropharynx: Clear, Fair Dentition,    Neck:  FROM, No Cervical Lymphadenopathy nor Thyromegaly or Carotid Bruit; No JVD; Breasts:: Not examined CHEST WALL: No tenderness CHEST: Normal respiration, clear to auscultation bilaterally HEART: Regular rate and rhythm; no murmurs rubs or gallops BACK: No kyphosis or scoliosis; No CVA tenderness ABDOMEN: Positive Bowel Sounds, Obese, Soft Non-Tender, No Rebound or Guarding; No Masses, No Organomegaly Rectal Exam: Not done EXTREMITIES: +Inflammation and Erythema of the Left ARM and large Abscess on Dorsum  Left Foot   RUE and BLEs: without Cyanosis, Clubbing, or Edema Genitalia: not examined PULSES: 2+ and symmetric SKIN: Normal hydration no rash or ulceration CNS:  Alert and Oriented x 4, No Focal Deficits Vascular: pulses palpable throughout    Labs on Admission:  Basic Metabolic Panel:  Recent Labs Lab 02/28/15 0016  NA 134*  K 4.2  CL 97  CO2 24  GLUCOSE 86  BUN 15  CREATININE 0.89  CALCIUM 8.9   Liver Function Tests:  Recent Labs Lab 02/28/15 0016  AST 19  ALT 13  ALKPHOS 81  BILITOT 0.4  PROT 7.4  ALBUMIN 3.4*   No results for input(s): LIPASE, AMYLASE in the last 168 hours. No results for input(s): AMMONIA in the last 168 hours. CBC:  Recent Labs Lab 02/28/15 0016  WBC 15.0*  NEUTROABS 10.9*  HGB 13.3  HCT 39.4  MCV 89.5  PLT 286   Cardiac Enzymes: No results for input(s): CKTOTAL, CKMB, CKMBINDEX, TROPONINI in the last 168 hours.  BNP (last 3 results) No results for input(s): BNP in the last 8760 hours.  ProBNP (last 3 results) No results for input(s): PROBNP in the last 8760 hours.  CBG: No results for input(s): GLUCAP in the last 168  hours.  Radiological Exams on Admission: No results found.     Assessment/Plan:   29 y.o. male with  Principal Problem:   1.   Sepsis/Cellulitis- of Left Arm   Blood and urine Cultures sent   IV Vancomycin and Zosyn   IVFs      Active Problems:   2.   Abscess of foot   I+D by EDP Dr. Wilkie Aye   IV Abxs in #1.       3.  IV drug abuse   UDS sent     4.   Tobacco Use Disorder   Nicotine Patch     5.  DVT Prophylaxis   Lovenox        Code Status:     FULL CODE  Family Communication:   Father at Bedside     Disposition Plan:    Inpatient Status        Time spent:  49 Minutes      Ron Parker [facility] Pager [phone]   If 7AM -7PM Please Contact the Day Rounding Team MD for [facility]  If 7PM-7AM, Please Contact Night-Floor Coverage  [email or web address] Password [email or web address] 02/28/2015, 2:46 AM     ADDENDUM:   Patient was seen and examined on 02/28/2015

## 2015-03-01 LAB — URINE CULTURE
Colony Count: NO GROWTH
Culture: NO GROWTH

## 2015-03-04 NOTE — Discharge Summary (Signed)
Physician Discharge Summary  Erik [name] [id number] DOB: 03/05/1986 DOA: 02/27/2015  PCP: Default, Provider, MD  Admit date: 02/27/2015 Discharge date: 03/04/2015  Erik [name] LEFT AMA  Recommendations for Outpatient Follow-up:  1. Left AMA  Discharge Diagnoses:  Principal Problem:   Cellulitis Active Problems:   IV drug abuse   Abscess of foot     Filed Weights   02/27/15 2145 02/28/15 0028  Weight: 99.791 kg (220 lb) 94.901 kg (209 lb 3.5 oz)    History of present illness:  Erik [name] is a 29 y.o. male with a history of IV Drug Abuse (Heroin) who presents to the ED with complaints of 1 week of fevers and chills and progressive redness, swelling and pain of his left arm, and left foot. He believes that the skin infections were due to multiple blood draw attempts that were done when he was in the [facility] 2 weeks ago. He also reports last using Heroin 2 weeks ago. He reports having temperature to 104 at home. [facility] and was to be referred to [facility] for [facility] but he did not want to wait, so he drove [facility] the [facility].   Hospital Course:  Left AMA despite risks- nurse spoke at length with patient    Discharge Exam: Filed Vitals:   02/28/15 1324  BP: 121/67  Pulse: 92  Temp: 98.3 F (36.8 C)  Resp: 18      Discharge Instructions    Discharge Medication List as of 02/28/2015  2:49 PM    CONTINUE these medications which have NOT CHANGED   Details  amoxicillin (AMOXIL) 500 MG capsule Take 500 mg by mouth 4 (four) times daily - after meals and at bedtime., Until Discontinued, Historical Med    DULoxetine (CYMBALTA) 30 MG capsule Take 30 mg by mouth 2 (two) times daily., Until Discontinued, Historical Med    promethazine (PHENERGAN) 25 MG tablet Take 25 mg by mouth every 6 (six) hours as needed for nausea or vomiting., Until Discontinued, Historical Med       Allergies   Allergen Reactions  . Codeine Nausea And Vomiting  . Levetiracetam Other (See Comments)    "said he became severely ill and angry mood swings" MD stopped medication  . Neurontin [Gabapentin] Other (See Comments)    "Feels out of it"  . Red Dye     Red dye #3  . Vicodin [Hydrocodone-Acetaminophen] Itching and Rash      The results of significant diagnostics from this hospitalization (including imaging, microbiology, ancillary and laboratory) are listed below for reference.    Significant Diagnostic Studies: No results found.  Microbiology: Recent Results (from the past 240 hour(s))  Blood Culture (routine x 2)     Status: None (Preliminary result)   Collection Time: 02/27/15 10:10 PM  Result Value Ref Range Status   Specimen Description BLOOD RIGHT HAND  Final   Special Requests BOTTLES DRAWN AEROBIC AND ANAEROBIC 10CC  Final   Culture   Final           BLOOD CULTURE RECEIVED NO GROWTH TO DATE CULTURE WILL BE HELD FOR 5 DAYS BEFORE ISSUING A FINAL NEGATIVE REPORT Note: Culture results may be compromised due to an excessive volume of blood received in culture bottles. Performed at [facility]    Report Status PENDING  Incomplete  Blood Culture (routine x 2)     Status: None (Preliminary result)   Collection Time:  02/27/15 10:16 PM  Result Value Ref Range Status   Specimen Description BLOOD RIGHT ARM  Final   Special Requests BOTTLES DRAWN AEROBIC ONLY 10CC  Final   Culture   Final           BLOOD CULTURE RECEIVED NO GROWTH TO DATE CULTURE WILL BE HELD FOR 5 DAYS BEFORE ISSUING A FINAL NEGATIVE REPORT Note: Culture results may be compromised due to an excessive volume of blood received in culture bottles. Performed at [facility]    Report Status PENDING  Incomplete  Urine culture     Status: None   Collection Time: 02/28/15 12:42 AM  Result Value Ref Range Status   Specimen Description URINE, CLEAN CATCH  Final   Special Requests NONE  Final   Colony  Count NO GROWTH Performed at [facility]   Final   Culture NO GROWTH Performed at [facility]   Final   Report Status 03/01/2015 FINAL  Final     Labs: Basic Metabolic Panel:  Recent Labs Lab 02/28/15 0016 02/28/15 0444  NA 134* 137  K 4.2 4.0  CL 97 105  CO2 24 23  GLUCOSE 86 136*  BUN 15 11  CREATININE 0.89 0.88  CALCIUM 8.9 8.2*   Liver Function Tests:  Recent Labs Lab 02/28/15 0016  AST 19  ALT 13  ALKPHOS 81  BILITOT 0.4  PROT 7.4  ALBUMIN 3.4*   No results for input(s): LIPASE, AMYLASE in the last 168 hours. No results for input(s): AMMONIA in the last 168 hours. CBC:  Recent Labs Lab 02/28/15 0016 02/28/15 0444  WBC 15.0* 11.4*  NEUTROABS 10.9*  --   HGB 13.3 12.2*  HCT 39.4 36.0*  MCV 89.5 89.8  PLT 286 228   Cardiac Enzymes: No results for input(s): CKTOTAL, CKMB, CKMBINDEX, TROPONINI in the last 168 hours. BNP: BNP (last 3 results) No results for input(s): BNP in the last 8760 hours.  ProBNP (last 3 results) No results for input(s): PROBNP in the last 8760 hours.  CBG: No results for input(s): GLUCAP in the last 168 hours.     SignedMarlin Canary  [facility] 03/04/2015, 12:42 PM

## 2015-03-06 LAB — CULTURE, BLOOD (ROUTINE X 2)
CULTURE: NO GROWTH
Culture: NO GROWTH

## 2015-03-22 DIAGNOSIS — L03119 Cellulitis of unspecified part of limb: Secondary | ICD-10-CM

## 2015-03-22 DIAGNOSIS — L02619 Cutaneous abscess of unspecified foot: Secondary | ICD-10-CM | POA: Insufficient documentation

## 2015-03-22 DIAGNOSIS — L03114 Cellulitis of left upper limb: Secondary | ICD-10-CM | POA: Insufficient documentation

## 2015-04-26 ENCOUNTER — Encounter (HOSPITAL_BASED_OUTPATIENT_CLINIC_OR_DEPARTMENT_OTHER): Payer: Self-pay | Admitting: *Deleted

## 2015-04-26 DIAGNOSIS — R451 Restlessness and agitation: Secondary | ICD-10-CM | POA: Diagnosis not present

## 2015-04-26 DIAGNOSIS — Z8679 Personal history of other diseases of the circulatory system: Secondary | ICD-10-CM | POA: Diagnosis not present

## 2015-04-26 DIAGNOSIS — Z8719 Personal history of other diseases of the digestive system: Secondary | ICD-10-CM | POA: Diagnosis not present

## 2015-04-26 DIAGNOSIS — Z8739 Personal history of other diseases of the musculoskeletal system and connective tissue: Secondary | ICD-10-CM | POA: Insufficient documentation

## 2015-04-26 DIAGNOSIS — H6691 Otitis media, unspecified, right ear: Secondary | ICD-10-CM | POA: Diagnosis not present

## 2015-04-26 DIAGNOSIS — Z720 Tobacco use: Secondary | ICD-10-CM | POA: Diagnosis not present

## 2015-04-26 DIAGNOSIS — H9201 Otalgia, right ear: Secondary | ICD-10-CM | POA: Diagnosis present

## 2015-04-26 NOTE — ED Notes (Signed)
Pt c/o right ear pain after vomiting x 1 day ago

## 2015-04-27 ENCOUNTER — Emergency Department (HOSPITAL_BASED_OUTPATIENT_CLINIC_OR_DEPARTMENT_OTHER)
Admission: EM | Admit: 2015-04-27 | Discharge: 2015-04-27 | Disposition: A | Discharge status: Home / Self Care | Payer: Medicaid Other | Attending: Emergency Medicine | Admitting: Emergency Medicine

## 2015-04-27 ENCOUNTER — Emergency Department (HOSPITAL_BASED_OUTPATIENT_CLINIC_OR_DEPARTMENT_OTHER): Payer: Medicaid Other

## 2015-04-27 DIAGNOSIS — H6691 Otitis media, unspecified, right ear: Secondary | ICD-10-CM

## 2015-04-27 MED ORDER — FENTANYL CITRATE (PF) 100 MCG/2ML IJ SOLN
100.0000 ug | Freq: Once | INTRAMUSCULAR | Status: AC
  Administered 2015-04-27: 100 ug via INTRAVENOUS
  Filled 2015-04-27: qty 2

## 2015-04-27 MED ORDER — SODIUM CHLORIDE 0.9 % IV SOLN: INTRAVENOUS | Status: DC

## 2015-04-27 MED ORDER — AMOXICILLIN-POT CLAVULANATE 875-125 MG PO TABS: 1.0000 | ORAL_TABLET | Freq: Two times a day (BID) | ORAL | Status: DC

## 2015-04-27 MED ORDER — AMOXICILLIN-POT CLAVULANATE 875-125 MG PO TABS
1.0000 | ORAL_TABLET | Freq: Once | ORAL | Status: AC
  Administered 2015-04-27: 1 via ORAL
  Filled 2015-04-27: qty 1

## 2015-04-27 MED ORDER — IOHEXOL 300 MG/ML  SOLN
100.0000 mL | Freq: Once | INTRAMUSCULAR | Status: AC | PRN
  Administered 2015-04-27: 100 mL via INTRAVENOUS

## 2015-04-27 NOTE — ED Notes (Signed)
MD at bedside. 

## 2015-04-27 NOTE — ED Notes (Signed)
Patient anxious to leave now that ride is here. Meds given and patient d/c'd to home

## 2015-04-27 NOTE — ED Provider Notes (Addendum)
CSN: [id number]     Arrival date & time 04/26/15  2247 History   First MD Initiated Contact with Patient May 13, 2015 0249     Chief Complaint  Patient presents with  . Ear Pain      (Consider location/radiation/quality/duration/timing/severity/associated sxs/prior Treatment) HPI  This is a 29 year old male with a history of esophageal stricture that causes him to vomit frequently. He vomited 2 days ago and subsequently developed pain in his right ear that has gradually worsened and is now severe. The pain is worse with movement of his external ear or with sound. He is having difficulty hearing in that ear and sounds are very muffled. He has had drainage of fluid from that ear as well as into his sinuses and throat.   Past Medical History  Diagnosis Date  . GERD (gastroesophageal reflux disease)   . Neck pain   . Migraine   . Arthritis    Past Surgical History  Procedure Laterality Date  . Appendectomy      09/05/2011  . Wisdom tooth extraction     History reviewed. No pertinent family history. History  Substance Use Topics  . Smoking status: Current Every Day Smoker -- 1.00 packs/day  . Smokeless tobacco: Not on file  . Alcohol Use: Yes     Comment: rarely    Review of Systems  All other systems reviewed and are negative.   Allergies  Codeine; Levetiracetam; Neurontin; Red dye; and Vicodin  Home Medications   Prior to Admission medications   Not on File   BP 110/67 mmHg  Pulse 66  Temp(Src) 98.6 F (37 C) (Oral)  Resp 16  Wt 209 lb (94.802 kg)  SpO2 100%   Physical Exam  General: Well-developed, well-nourished male in no acute distress; appearance consistent with age of record HENT: normocephalic; atraumatic; left TM normal, left external ear normal; yellowish material behind right TM without obvious rupture, significant pain on movement of the right external ear with right mastoid tenderness; pharynx normal; right rhinorrhea Eyes: pupils equal, round and  reactive to light; extraocular muscles intact Neck: supple Heart: regular rate and rhythm Lungs: clear to auscultation bilaterally Abdomen: soft; nondistended Extremities: No deformity; full range of motion; pulses normal Neurologic: Awake, alert and oriented; motor function intact in all extremities and symmetric; no facial droop Skin: Warm and dry Psychiatric: Mildly agitated    ED Course  Procedures (including critical care time)   MDM  Nursing notes and vitals signs, including pulse oximetry, reviewed.  Summary of this visit's results, reviewed by myself:  Imaging Studies: Ct Temporal Bones W/cm  2015/05/13   CLINICAL DATA:  RIGHT ear pain and vomiting for 1 day.  EXAM: CT TEMPORAL BONES WITH CONTRAST  TECHNIQUE: Axial and coronal plane CT imaging of the petrous temporal bones was performed with thin-collimation image reconstruction after intravenous contrast administration. Multiplanar CT image reconstructions were also generated.  CONTRAST:  OMNIPAQUE IOHEXOL 300 MG/ML  SOLN  COMPARISON:  CT head July 01, 2012  FINDINGS: Left: External auditory canal is well formed, with mild circumferential nonenhancing soft tissue thickening, effacing the fundus. Tympanic membrane is not thickened or retracted. The scutum is sharp. Nonenhancing soft tissue completely opacifies the middle ear including Prussak's space. Ossicles are well formed and located. Effaced aditus ad antrum. Nonenhancing soft tissue nearly completely opacifies mastoid air cells without coalescence. Intact tegmen tympani. Intact otic capsule with normal appearance of the inner ear structures. No internal auditory canal expansion. No definite cerebellar pontine angle  masses.  Right: External auditory canal is well formed and aerated. Tympanic membrane is not thickened or retracted. The scutum is sharp. Well aerated middle ear including Prussak's space. Ossicles are well formed and located. Patent aditus ad antrum. Well  aerated mastoid air cells without coalescence. Intact tegmen tympani. Intact otic capsule with normal appearance of the inner ear structures. No internal auditory canal expansion. No definite cerebellar pontine angle masses.  IMPRESSION: Severe RIGHT middle ear and mastoid effusion. Circumferential soft tissue within the RIGHT external auditory canal may be reactive or, otitis externus without bone destruction or abscess.   Electronically Signed   By: Awilda Metroourtnay  Bloomer M.D.   On: 04/27/2015 04:19   4:50 AM Discussed with Dr. Lazarus SalinesWolicki who advises antibiotic and close follow-up in his office. Patient is admitted heroin addict and will not be given a prescription for opiate analgesics. He is unable to stay for IV anti-biotics so we will start him on Augmentin here.  The patient admitted to his nurse that he is an IV heroin addict. No opioid prescriptions will be given.   Paula LibraJohn Zameria Vogl, MD 04/27/15 0451  Paula LibraJohn Aysia Lowder, MD 04/27/15 [id number]  Paula LibraJohn Rasheka Denard, MD 04/28/15 [phone]

## 2015-11-30 ENCOUNTER — Emergency Department (HOSPITAL_COMMUNITY)
Admission: EM | Admit: 2015-11-30 | Discharge: 2015-11-30 | Discharge status: Against Medical Advice | Payer: Medicaid Other | Attending: Emergency Medicine | Admitting: Emergency Medicine

## 2015-11-30 ENCOUNTER — Encounter (HOSPITAL_COMMUNITY): Payer: Self-pay | Admitting: Family Medicine

## 2015-11-30 ENCOUNTER — Emergency Department (HOSPITAL_COMMUNITY): Payer: Medicaid Other

## 2015-11-30 DIAGNOSIS — F17200 Nicotine dependence, unspecified, uncomplicated: Secondary | ICD-10-CM | POA: Insufficient documentation

## 2015-11-30 DIAGNOSIS — Y9389 Activity, other specified: Secondary | ICD-10-CM | POA: Insufficient documentation

## 2015-11-30 DIAGNOSIS — W228XXA Striking against or struck by other objects, initial encounter: Secondary | ICD-10-CM | POA: Diagnosis not present

## 2015-11-30 DIAGNOSIS — Y9289 Other specified places as the place of occurrence of the external cause: Secondary | ICD-10-CM | POA: Insufficient documentation

## 2015-11-30 DIAGNOSIS — S1095XA Superficial foreign body of unspecified part of neck, initial encounter: Secondary | ICD-10-CM | POA: Diagnosis present

## 2015-11-30 DIAGNOSIS — Y998 Other external cause status: Secondary | ICD-10-CM | POA: Diagnosis not present

## 2015-11-30 DIAGNOSIS — M795 Residual foreign body in soft tissue: Secondary | ICD-10-CM

## 2015-11-30 NOTE — ED Notes (Signed)
Pt here for needle stuck in left neck area. sts PTA.

## 2015-11-30 NOTE — ED Notes (Signed)
Attempted to call patient again, no answer.

## 2015-11-30 NOTE — ED Notes (Signed)
Called patient x3 in the waiting room, unable to find. Will retry

## 2015-11-30 NOTE — ED Provider Notes (Signed)
Blood pressure 140/75, pulse 98, temperature 98.5 F (36.9 C), temperature source Oral, resp. rate 22, height 6\' 1"  (1.854 m), weight 104.327 kg, SpO2 97 %.  Lorelee MarketRichard Kemnitz is a 30 y.o. male complaining of a needle stuck in neck. Patient left without being seen after triage. I did not participate in the care of this patient.  Wynetta Emeryicole Nailah Luepke, PA-C 11/30/15 1341  Leta BaptistEmily Roe Nguyen, MD 12/03/15 [phone]

## 2017-07-13 ENCOUNTER — Emergency Department (HOSPITAL_COMMUNITY): Payer: Medicaid Other

## 2017-07-13 ENCOUNTER — Encounter (HOSPITAL_COMMUNITY): Payer: Self-pay

## 2017-07-13 ENCOUNTER — Inpatient Hospital Stay (HOSPITAL_COMMUNITY)
Admission: EM | Admit: 2017-07-13 | Discharge: 2017-07-14 | DRG: 811 | Discharge status: Against Medical Advice | Payer: Medicaid Other | Attending: Internal Medicine | Admitting: Internal Medicine

## 2017-07-13 DIAGNOSIS — I071 Rheumatic tricuspid insufficiency: Secondary | ICD-10-CM | POA: Diagnosis present

## 2017-07-13 DIAGNOSIS — I079 Rheumatic tricuspid valve disease, unspecified: Secondary | ICD-10-CM | POA: Diagnosis present

## 2017-07-13 DIAGNOSIS — Z881 Allergy status to other antibiotic agents status: Secondary | ICD-10-CM

## 2017-07-13 DIAGNOSIS — Z9119 Patient's noncompliance with other medical treatment and regimen: Secondary | ICD-10-CM

## 2017-07-13 DIAGNOSIS — K219 Gastro-esophageal reflux disease without esophagitis: Secondary | ICD-10-CM | POA: Diagnosis present

## 2017-07-13 DIAGNOSIS — F17210 Nicotine dependence, cigarettes, uncomplicated: Secondary | ICD-10-CM | POA: Diagnosis present

## 2017-07-13 DIAGNOSIS — I110 Hypertensive heart disease with heart failure: Secondary | ICD-10-CM | POA: Diagnosis present

## 2017-07-13 DIAGNOSIS — I368 Other nonrheumatic tricuspid valve disorders: Secondary | ICD-10-CM

## 2017-07-13 DIAGNOSIS — I160 Hypertensive urgency: Secondary | ICD-10-CM | POA: Diagnosis present

## 2017-07-13 DIAGNOSIS — D649 Anemia, unspecified: Principal | ICD-10-CM | POA: Diagnosis present

## 2017-07-13 DIAGNOSIS — F1910 Other psychoactive substance abuse, uncomplicated: Secondary | ICD-10-CM | POA: Diagnosis present

## 2017-07-13 DIAGNOSIS — Z888 Allergy status to other drugs, medicaments and biological substances status: Secondary | ICD-10-CM

## 2017-07-13 DIAGNOSIS — Z885 Allergy status to narcotic agent status: Secondary | ICD-10-CM

## 2017-07-13 DIAGNOSIS — G43909 Migraine, unspecified, not intractable, without status migrainosus: Secondary | ICD-10-CM | POA: Diagnosis present

## 2017-07-13 DIAGNOSIS — N179 Acute kidney failure, unspecified: Secondary | ICD-10-CM | POA: Diagnosis present

## 2017-07-13 DIAGNOSIS — I509 Heart failure, unspecified: Secondary | ICD-10-CM | POA: Diagnosis present

## 2017-07-13 DIAGNOSIS — M1990 Unspecified osteoarthritis, unspecified site: Secondary | ICD-10-CM | POA: Diagnosis present

## 2017-07-13 DIAGNOSIS — Z9102 Food additives allergy status: Secondary | ICD-10-CM

## 2017-07-13 DIAGNOSIS — R601 Generalized edema: Secondary | ICD-10-CM | POA: Diagnosis present

## 2017-07-13 DIAGNOSIS — N289 Disorder of kidney and ureter, unspecified: Secondary | ICD-10-CM

## 2017-07-13 DIAGNOSIS — I330 Acute and subacute infective endocarditis: Secondary | ICD-10-CM | POA: Diagnosis present

## 2017-07-13 HISTORY — DX: Anxiety disorder, unspecified: F41.9

## 2017-07-13 HISTORY — DX: Heart failure, unspecified: I50.9

## 2017-07-13 HISTORY — DX: Disorder of kidney and ureter, unspecified: N28.9

## 2017-07-13 HISTORY — DX: Depression, unspecified: F32.A

## 2017-07-13 HISTORY — DX: Major depressive disorder, single episode, unspecified: F32.9

## 2017-07-13 HISTORY — DX: Essential (primary) hypertension: I10

## 2017-07-13 LAB — URINALYSIS, ROUTINE W REFLEX MICROSCOPIC
BILIRUBIN URINE: NEGATIVE
Glucose, UA: 50 mg/dL — AB
KETONES UR: NEGATIVE mg/dL
LEUKOCYTES UA: NEGATIVE
NITRITE: NEGATIVE
PROTEIN: 100 mg/dL — AB
Specific Gravity, Urine: 1.008 (ref 1.005–1.030)
Squamous Epithelial / LPF: NONE SEEN
pH: 6 (ref 5.0–8.0)

## 2017-07-13 LAB — CBC WITH DIFFERENTIAL/PLATELET
Basophils Absolute: 0 10*3/uL (ref 0.0–0.1)
Basophils Relative: 0 %
Eosinophils Absolute: 0.2 10*3/uL (ref 0.0–0.7)
Eosinophils Relative: 4 %
HCT: 20.5 % — ABNORMAL LOW (ref 39.0–52.0)
Hemoglobin: 6.8 g/dL — CL (ref 13.0–17.0)
LYMPHS ABS: 1.3 10*3/uL (ref 0.7–4.0)
Lymphocytes Relative: 21 %
MCH: 29.2 pg (ref 26.0–34.0)
MCHC: 33.2 g/dL (ref 30.0–36.0)
MCV: 88 fL (ref 78.0–100.0)
Monocytes Absolute: 0.5 10*3/uL (ref 0.1–1.0)
Monocytes Relative: 8 %
NEUTROS ABS: 4.2 10*3/uL (ref 1.7–7.7)
Neutrophils Relative %: 67 %
PLATELETS: UNDETERMINED 10*3/uL (ref 150–400)
RBC: 2.33 MIL/uL — ABNORMAL LOW (ref 4.22–5.81)
RDW: 19.7 % — ABNORMAL HIGH (ref 11.5–15.5)
WBC: 6.2 10*3/uL (ref 4.0–10.5)

## 2017-07-13 LAB — COMPREHENSIVE METABOLIC PANEL
ALT: 18 U/L (ref 17–63)
AST: 21 U/L (ref 15–41)
Albumin: 2.2 g/dL — ABNORMAL LOW (ref 3.5–5.0)
Alkaline Phosphatase: 113 U/L (ref 38–126)
Anion gap: 10 (ref 5–15)
BUN: 31 mg/dL — AB (ref 6–20)
CO2: 19 mmol/L — AB (ref 22–32)
CREATININE: 3.58 mg/dL — AB (ref 0.61–1.24)
Calcium: 7.2 mg/dL — ABNORMAL LOW (ref 8.9–10.3)
Chloride: 105 mmol/L (ref 101–111)
GFR calc Af Amer: 25 mL/min — ABNORMAL LOW (ref >60)
GFR calc non Af Amer: 21 mL/min — ABNORMAL LOW (ref >60)
Glucose, Bld: 101 mg/dL — ABNORMAL HIGH (ref 65–99)
Potassium: 3.5 mmol/L (ref 3.5–5.1)
SODIUM: 134 mmol/L — AB (ref 135–145)
Total Bilirubin: 0.3 mg/dL (ref 0.3–1.2)
Total Protein: 7.7 g/dL (ref 6.5–8.1)

## 2017-07-13 LAB — RAPID URINE DRUG SCREEN, HOSP PERFORMED
Amphetamines: NOT DETECTED
BARBITURATES: NOT DETECTED
BENZODIAZEPINES: NOT DETECTED
Cocaine: NOT DETECTED
Opiates: POSITIVE — AB
Tetrahydrocannabinol: NOT DETECTED

## 2017-07-13 LAB — ABO/RH: ABO/RH(D): O POS

## 2017-07-13 LAB — RETICULOCYTES
RBC.: 2.12 MIL/uL — AB (ref 4.22–5.81)
RETIC CT PCT: 3.7 % — AB (ref 0.4–3.1)
Retic Count, Absolute: 78.4 10*3/uL (ref 19.0–186.0)

## 2017-07-13 LAB — BRAIN NATRIURETIC PEPTIDE: B Natriuretic Peptide: 474.5 pg/mL — ABNORMAL HIGH (ref 0.0–100.0)

## 2017-07-13 LAB — PREPARE RBC (CROSSMATCH)

## 2017-07-13 LAB — I-STAT TROPONIN, ED: Troponin i, poc: 0 ng/mL (ref 0.00–0.08)

## 2017-07-13 LAB — SEDIMENTATION RATE: SED RATE: 125 mm/h — AB (ref 0–16)

## 2017-07-13 LAB — LIPASE, BLOOD: Lipase: 21 U/L (ref 11–51)

## 2017-07-13 MED ORDER — ACETAMINOPHEN 325 MG PO TABS: 650.0000 mg | ORAL_TABLET | Freq: Four times a day (QID) | ORAL | Status: DC | PRN

## 2017-07-13 MED ORDER — POLYETHYLENE GLYCOL 3350 17 G PO PACK: 17.0000 g | PACK | Freq: Every day | ORAL | Status: DC | PRN

## 2017-07-13 MED ORDER — ONDANSETRON HCL 4 MG/2ML IJ SOLN: 4.0000 mg | Freq: Four times a day (QID) | INTRAMUSCULAR | Status: DC | PRN

## 2017-07-13 MED ORDER — ONDANSETRON HCL 4 MG PO TABS: 4.0000 mg | ORAL_TABLET | Freq: Four times a day (QID) | ORAL | Status: DC | PRN

## 2017-07-13 MED ORDER — TRAMADOL HCL 50 MG PO TABS: 50.0000 mg | ORAL_TABLET | Freq: Four times a day (QID) | ORAL | Status: DC | PRN

## 2017-07-13 MED ORDER — SODIUM CHLORIDE 0.9% FLUSH: 3.0000 mL | INTRAVENOUS | Status: DC | PRN

## 2017-07-13 MED ORDER — HYDRALAZINE HCL 20 MG/ML IJ SOLN: 10.0000 mg | INTRAMUSCULAR | Status: DC | PRN

## 2017-07-13 MED ORDER — SODIUM CHLORIDE 0.9 % IV SOLN: 250.0000 mL | INTRAVENOUS | Status: DC | PRN

## 2017-07-13 MED ORDER — HYDROMORPHONE HCL 2 MG PO TABS
2.0000 mg | ORAL_TABLET | ORAL | Status: DC | PRN
  Administered 2017-07-14: 2 mg via ORAL
  Filled 2017-07-13: qty 1

## 2017-07-13 MED ORDER — SODIUM CHLORIDE 0.9 % IV SOLN: Freq: Once | INTRAVENOUS | Status: DC

## 2017-07-13 MED ORDER — BISACODYL 10 MG RE SUPP: 10.0000 mg | Freq: Every day | RECTAL | Status: DC | PRN

## 2017-07-13 MED ORDER — SODIUM CHLORIDE 0.9% FLUSH
3.0000 mL | Freq: Two times a day (BID) | INTRAVENOUS | Status: DC
  Administered 2017-07-13: 3 mL via INTRAVENOUS

## 2017-07-13 MED ORDER — ACETAMINOPHEN 650 MG RE SUPP: 650.0000 mg | Freq: Four times a day (QID) | RECTAL | Status: DC | PRN

## 2017-07-13 NOTE — ED Triage Notes (Signed)
Per EMS- patient c/o generalized body edema, especially bilateral legs and scrotal. Patient reported to EMS that he has been seen for the edema at several hospitals. Patient also reported that he took Roxy's that were not his for bilateral leg pain. Patient reported that he has had edema for a long time and was due to his drug use.

## 2017-07-13 NOTE — H&P (Signed)
History and Physical    Erik [name] UGQ:[id number] DOB: 1986-01-16 DOA: 07/13/2017  PCP: Default, Provider, MD   Patient coming from: Home   Chief Complaint: Swelling, fatigue   HPI: Erik [name] is a 31 y.o. male with medical history significant for IV drug abuse, tricuspid valve vegetation, anemia, and kidney disease, presented to the emergency department for evaluation of swelling involving the legs and scrotum, as well as fatigue and malaise. 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 He was treated with oxacillin and scheduled for valve replacement, but left the hospital AMA prior to surgery. He later returned and was admitted again last month with his creatinine, recently normal,elevated to 8.4. He was treated with IV antibiotics and supportive care, creatinine improved to 6.65 without dialysis, and the patient again left AMA on 06/23/2017. He was given a prescription for Augmentin when he left. He denies any recent fevers or chills, denies any significant cough, but reports worsening exertional dyspnea, fatigue, bilateral lower extremity swelling, and marked edema involving his scrotum. He was also suspected of having septic pulmonary emboli during the recent admission. He had back pain that was evaluated with MRI without clear infectious process.  ED Course: Upon arrival to the ED, patient is found to be afebrile, saturating well on room air, hypertensive to 170/125, and with vitals otherwise stable.EKG features a normal sinus rhythm and chest x-ray is notable for mild cardiomegaly without overt edema, and patchy peripheral lower lung opacities, possibly reflecting foci of infection or inflammation. Chemistry panel features a sodium of 134, BUN of 31, and creatinine of 3.58, improved from 6.65 at the end of July 2018. Albumin is 2.2. CBC is notable for a hemoglobin of 6.8,  down from 7.3 when he left the hospital AMA at the end of July. He remained hemodynamically stable and in no apparent respiratory distress in the ED and will be admitted to the telemetry unit at [facility] for ongoing evaluation and management of symptomatic anemia, kidney disease, and edema.  Review of Systems:  All other systems reviewed and apart from HPI, are negative.  Past Medical History:  Diagnosis Date  . Anxiety   . Arthritis   . CHF (congestive heart failure) (HCC)   . Depression   . GERD (gastroesophageal reflux disease)   . Hypertension   . Migraine   . Migraine   . Neck pain   . Renal disorder     Past Surgical History:  Procedure Laterality Date  . APPENDECTOMY     09/05/2011  . WISDOM TOOTH EXTRACTION       reports that he has been smoking.  He has been smoking about 1.00 pack per day. He has never used smokeless tobacco. He reports that he drinks alcohol. He reports that he uses drugs.  Allergies  Allergen Reactions  . Codeine Nausea And Vomiting  . Levetiracetam Other (See Comments)    "said he became severely ill and angry mood swings" MD stopped medication  . Neurontin [Gabapentin] Other (See Comments)    "Feels out of it"  . Red Dye     Red dye #3  . Vicodin [Hydrocodone-Acetaminophen] Itching and Rash    History reviewed. No pertinent family history.   Prior to Admission medications   Medication Sig Start Date End Date Taking? Authorizing Provider  amoxicillin-clavulanate (AUGMENTIN) 875-125 MG per tablet Take 1 tablet by mouth 2 (two)  times daily. One po bid x 7 days Patient not taking: Reported on 07/13/2017 04/27/15   Molpus, Jonny Ruiz, MD    Physical Exam: Vitals:   07/13/17 1647 07/13/17 1722 07/13/17 1811  BP: (!) 186/132 (!) 171/128 (!) 178/130  Pulse: 92  91  Resp: 14  16  Temp: 98.1 F (36.7 C)  98 F (36.7 C)  TempSrc: Oral  Oral  SpO2: 100%  100%  Weight: 90.7 kg (200 lb)    Height: 6\' 1"  (1.854 m)         Constitutional: NAD, calm, appears uncomfortable Eyes: PERTLA, lids and conjunctivae normal ENMT: Mucous membranes are moist. Posterior pharynx clear of any exudate or lesions.   Neck: normal, supple, no masses, no thyromegaly Respiratory: Fine rales in both bases, no wheezing. Normal respiratory effort.  Cardiovascular: S1 & S2 heard, regular rate and rhythm, grade 3 holosystolic murmur at lower sternal borders. 2+ bilateral LE edema.   Abdomen: No distension, no tenderness, no masses palpated. Bowel sounds active.  Musculoskeletal: no clubbing / cyanosis. No joint deformity upper and lower extremities.   Skin: Scattered excoriations and punctate scars. Pale. Warm, dry, well-perfused. Neurologic: CN 2-12 grossly intact. Sensation intact, DTR normal. Strength 5/5 in all 4 limbs.  Psychiatric: Alert and oriented x 3. Anxious.     Labs on Admission: I have personally reviewed following labs and imaging studies  CBC:  Recent Labs Lab 07/13/17 1806  WBC 6.2  NEUTROABS 4.2  HGB 6.8*  HCT 20.5*  MCV 88.0  PLT PLATELET CLUMPS NOTED ON SMEAR, UNABLE TO ESTIMATE   Basic Metabolic Panel:  Recent Labs Lab 07/13/17 1806  NA 134*  K 3.5  CL 105  CO2 19*  GLUCOSE 101*  BUN 31*  CREATININE 3.58*  CALCIUM 7.2*   GFR: Estimated Creatinine Clearance: 34.4 mL/min (A) (by C-G formula based on SCr of 3.58 mg/dL (H)). Liver Function Tests:  Recent Labs Lab 07/13/17 1806  AST 21  ALT 18  ALKPHOS 113  BILITOT 0.3  PROT 7.7  ALBUMIN 2.2*    Recent Labs Lab 07/13/17 1806  LIPASE 21   No results for input(s): AMMONIA in the last 168 hours. Coagulation Profile: No results for input(s): INR, PROTIME in the last 168 hours. Cardiac Enzymes: No results for input(s): CKTOTAL, CKMB, CKMBINDEX, TROPONINI in the last 168 hours. BNP (last 3 results) No results for input(s): PROBNP in the last 8760 hours. HbA1C: No results for input(s): HGBA1C in the last 72 hours. CBG: No  results for input(s): GLUCAP in the last 168 hours. Lipid Profile: No results for input(s): CHOL, HDL, LDLCALC, TRIG, CHOLHDL, LDLDIRECT in the last 72 hours. Thyroid Function Tests: No results for input(s): TSH, T4TOTAL, FREET4, T3FREE, THYROIDAB in the last 72 hours. Anemia Panel: No results for input(s): VITAMINB12, FOLATE, FERRITIN, TIBC, IRON, RETICCTPCT in the last 72 hours. Urine analysis:    Component Value Date/Time   COLORURINE YELLOW 02/28/2015 0042   APPEARANCEUR CLEAR 02/28/2015 0042   LABSPEC 1.012 02/28/2015 0042   PHURINE 6.0 02/28/2015 0042   GLUCOSEU NEGATIVE 02/28/2015 0042   HGBUR NEGATIVE 02/28/2015 0042   BILIRUBINUR NEGATIVE 02/28/2015 0042   KETONESUR NEGATIVE 02/28/2015 0042   PROTEINUR NEGATIVE 02/28/2015 0042   UROBILINOGEN 0.2 02/28/2015 0042   NITRITE NEGATIVE 02/28/2015 0042   LEUKOCYTESUR NEGATIVE 02/28/2015 0042   Sepsis Labs: @LABRCNTIP (procalcitonin:4,lacticidven:4) )No results found for this or any previous visit (from the past 240 hour(s)).   Radiological Exams on Admission: Dg Chest 2 View  Result Date: 07/13/2017 CLINICAL DATA:  Generalized edema EXAM: CHEST  2 VIEW COMPARISON:  05/29/2017 FINDINGS: Mild cardiomegaly. Decreased left upper lobe opacity with minimal residual, likely representing small focus of scarring. No significant pleural effusion. Patchy peripheral pulmonary opacity bilateral lower lung zones. No pneumothorax. IMPRESSION: 1. Mild cardiomegaly without overt edema 2. Further decrease in size of left upper lobe opacity with small focus of scar present 3. Patchy peripheral lower lung pulmonary opacity may reflect foci of infection or inflammation Electronically Signed   By: Jasmine Pang M.D.   On: 07/13/2017 18:45    EKG: Independently reviewed. Normal sinus rhythm.   Assessment/Plan  1. Symptomatic anemia  - Hgb is 6.8 on admission, down from 7.3 on July 29, and down from 9.3 earlier in July  - Denies melena or  hematochezia, no bleeding evident  - Plan to check anemia panel, transfuse 1 unit pRBCs, and repeat CBC post-transfusion    2. Peripheral edema  - Likely multifactorial; albumin only 2.2, has severely regurgitant tricuspid valve, and poor renal fxn  - Transfusion of pRBCs as above may transiently aid with oncotic pressures, and with renal fxn  - Hesitant to use Lasix given his stable respiratory status and as he is still recovering from severe AKI  - Supportive care with scrotal support, elevate legs    3. Tricuspid vegetation  - Pt noted to have severe tricuspid regurgitation and 1.2 x 1.6 cm mass/vegetation on the valve on echo from 06/23/17  - He was scheduled for valve replacement at [facility] last month, but left AMA prior to surgery  - He is still undecided about surgery    4. Kidney disease - SCr is 3.58 on admission - Had been 1.16 in early July, then 8.4 later in the month, trending down to 6.65 by 06/23/17 when he left [facility] AMA; he was treated conservatively, not dialyzed  - Appears to have continued improvement from severe AKI - Obstructive etiology was excluded with imaging during the recent hospitalization and there has been continued improvement since  - Avoid nephrotoxins where possible, avoid hypotension, trend BUN and Cr    5. Hypertension with hypertensive urgency  - DBP persistently elevated >100 in ED  - Use hydralazine IVP's for now    6. Drug abuse  - Pt reports that he has stopped using IV route and was commended for this  - He agrees with social work consultation     DVT prophylaxis: SCD's  Code Status: Full  Family Communication: Discussed with patient Disposition Plan: Admit to telemetry at [facility] Consults called: None Admission status: Inpatient    Briscoe Deutscher, MD [facility] Pager [phone]  If 7PM-7AM, please contact night-coverage [email or web address] Password [email or web address]  07/13/2017, 9:20 PM

## 2017-07-13 NOTE — ED Provider Notes (Signed)
[facility]-EMERGENCY DEPT Provider Note   CSN: [id number] Arrival date & time: 07/13/17  1637     History   Chief Complaint Chief Complaint  Patient presents with  . Leg Swelling    generalized body edema    HPI Erik [name] is a 31 y.o. male.  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 He was recently hospitalized at [facility]. He was being treated with intravenous antibiotics, however left AMA secondary to perceived lack of treatment and anxiety issues. This was the end of July.  He presents today with complaints of generalized swelling and "pain all over". He denies any fevers or chills. He denies chest pain but does feel short of breath.   The history is provided by the patient.    Past Medical History:  Diagnosis Date  . Anxiety   . Arthritis   . CHF (congestive heart failure) (HCC)   . Depression   . GERD (gastroesophageal reflux disease)   . Hypertension   . Migraine   . Migraine   . Neck pain   . Renal disorder     Patient Active Problem List   Diagnosis Date Noted  . Cellulitis of left upper extremity   . Cellulitis and abscess of foot   . Cellulitis 02/28/2015  . IV drug abuse 02/28/2015  . Abscess of foot 02/28/2015    Past Surgical History:  Procedure Laterality Date  . APPENDECTOMY     09/05/2011  . WISDOM TOOTH EXTRACTION         Home Medications    Prior to Admission medications   Medication Sig Start Date End Date Taking? Authorizing Provider  amoxicillin-clavulanate (AUGMENTIN) 875-125 MG per tablet Take 1 tablet by mouth 2 (two) times daily. One po bid x 7 days 04/27/15   Molpus, Jonny Ruiz, MD    Family History History reviewed. No pertinent family history.  Social History Social History  Substance Use Topics  . Smoking status: Current Every Day Smoker    Packs/day: 1.00  . Smokeless tobacco: Never Used  . Alcohol use Yes     Comment: rarely     Allergies     Codeine; Levetiracetam; Neurontin [gabapentin]; Red dye; and Vicodin [hydrocodone-acetaminophen]   Review of Systems Review of Systems  All other systems reviewed and are negative.    Physical Exam Updated Vital Signs BP (!) 171/128 (BP Location: Left Arm)   Pulse 92   Temp 98.1 F (36.7 C) (Oral)   Resp 14   Ht 6\' 1"  (1.854 m)   Wt 90.7 kg (200 lb)   SpO2 100%   BMI 26.39 kg/m   Physical Exam  Constitutional: He is oriented to person, place, and time. He appears well-developed and well-nourished. No distress.  HENT:  Head: Normocephalic and atraumatic.  Mouth/Throat: Oropharynx is clear and moist.  Neck: Normal range of motion. Neck supple.  Cardiovascular: Normal rate and regular rhythm.  Exam reveals no friction rub.   No murmur heard. Pulmonary/Chest: Effort normal and breath sounds normal. No respiratory distress. He has no wheezes. He has no rales.  Abdominal: Soft. Bowel sounds are normal. He exhibits no distension. There is no tenderness.  Genitourinary:  Genitourinary Comments: Scrotal edema present.  Musculoskeletal: Normal range of motion. He exhibits edema.  There is generalized anasarca present.  Neurological: He is alert and oriented to person, place, and time. Coordination normal.  Skin: Skin is warm and dry. He is not diaphoretic.  Nursing note and vitals reviewed.    ED Treatments / Results  Labs (all labs ordered are listed, but only abnormal results are displayed) Labs Reviewed  COMPREHENSIVE METABOLIC PANEL  LIPASE, BLOOD  CBC WITH DIFFERENTIAL/PLATELET  BRAIN NATRIURETIC PEPTIDE  URINALYSIS, ROUTINE W REFLEX MICROSCOPIC  RAPID URINE DRUG SCREEN, HOSP PERFORMED  I-STAT TROPONIN, ED    EKG  EKG Interpretation None       Radiology No results found.  Procedures Procedures (including critical care time)  Medications Ordered in ED Medications - No data to display   Initial Impression / Assessment and Plan / ED Course  I have  reviewed the triage vital signs and the nursing notes.  Pertinent labs & imaging results that were available during my care of the patient were reviewed by me and considered in my medical decision making (see chart for details).  Patient with history of IV drug abuse and infective endocarditis with complications of heart valve failure, acute renal failure, and chronic anemia. He presents here for evaluation of generalized swelling and pain. His workup reveals a hemoglobin of 6.8, creatinine of 3.5.   Patient will be admitted to the hospitalist service under the care of Dr. Antionette Char.  Final Clinical Impressions(s) / ED Diagnoses   Final diagnoses:  None    New Prescriptions New Prescriptions   No medications on file     Geoffery Lyons, MD 07/13/17 2347

## 2017-07-14 DIAGNOSIS — D649 Anemia, unspecified: Principal | ICD-10-CM

## 2017-07-14 LAB — COMPREHENSIVE METABOLIC PANEL
ALT: 18 U/L (ref 17–63)
ANION GAP: 8 (ref 5–15)
AST: 23 U/L (ref 15–41)
Albumin: 2.2 g/dL — ABNORMAL LOW (ref 3.5–5.0)
Alkaline Phosphatase: 109 U/L (ref 38–126)
BILIRUBIN TOTAL: 0.5 mg/dL (ref 0.3–1.2)
BUN: 34 mg/dL — ABNORMAL HIGH (ref 6–20)
CHLORIDE: 110 mmol/L (ref 101–111)
CO2: 19 mmol/L — ABNORMAL LOW (ref 22–32)
Calcium: 7.3 mg/dL — ABNORMAL LOW (ref 8.9–10.3)
Creatinine, Ser: 3.6 mg/dL — ABNORMAL HIGH (ref 0.61–1.24)
GFR calc Af Amer: 25 mL/min — ABNORMAL LOW (ref >60)
GFR, EST NON AFRICAN AMERICAN: 21 mL/min — AB (ref >60)
Glucose, Bld: 99 mg/dL (ref 65–99)
POTASSIUM: 3.7 mmol/L (ref 3.5–5.1)
Sodium: 137 mmol/L (ref 135–145)
TOTAL PROTEIN: 7.3 g/dL (ref 6.5–8.1)

## 2017-07-14 LAB — VITAMIN B12: VITAMIN B 12: 296 pg/mL (ref 180–914)

## 2017-07-14 LAB — CBC
HCT: 24.9 % — ABNORMAL LOW (ref 39.0–52.0)
Hemoglobin: 8.3 g/dL — ABNORMAL LOW (ref 13.0–17.0)
MCH: 28.5 pg (ref 26.0–34.0)
MCHC: 33.3 g/dL (ref 30.0–36.0)
MCV: 85.6 fL (ref 78.0–100.0)
PLATELETS: UNDETERMINED 10*3/uL (ref 150–400)
RBC: 2.91 MIL/uL — AB (ref 4.22–5.81)
RDW: 19.2 % — AB (ref 11.5–15.5)
WBC: 6.3 10*3/uL (ref 4.0–10.5)

## 2017-07-14 LAB — FOLATE: Folate: 14.5 ng/mL (ref >5.9)

## 2017-07-14 LAB — IRON AND TIBC
IRON: 30 ug/dL — AB (ref 45–182)
Saturation Ratios: 13 % — ABNORMAL LOW (ref 17.9–39.5)
TIBC: 224 ug/dL — AB (ref 250–450)
UIBC: 194 ug/dL

## 2017-07-14 LAB — HIV ANTIBODY (ROUTINE TESTING W REFLEX): HIV SCREEN 4TH GENERATION: NONREACTIVE

## 2017-07-14 LAB — FERRITIN: Ferritin: 152 ng/mL (ref 24–336)

## 2017-07-14 NOTE — ED Notes (Addendum)
Pt decided to leave AMA. Dr Alvino Chapel made aware that pt is leaving. Pt was advised that he needs medical care to prevent a worsening condition. Pt verbalized understanding. He reports that he will leave this facility via POV with family and go to [place]. Pt is A&O, ambulates w/o difficulty or dyspnea. Pt provided with copy of labs per Dr Alvino Chapel.

## 2017-07-14 NOTE — ED Notes (Signed)
Hospitalist at the bedside 

## 2017-07-14 NOTE — Clinical Social Work Note (Signed)
Clinical Social Work Assessment  Patient Details  Name: Erik [name] MRN: [id number] Date of Birth: 1986/05/27  Date of referral:  07/14/17               Reason for consult:  Substance Use/ETOH Abuse                Permission sought to share information with:  Case Manager Permission granted to share information::  Yes, Verbal Permission Granted  Name::        Agency::     Relationship::     Contact Information:     Housing/Transportation Living arrangements for the past 2 months:  Homeless Source of Information:  Patient Patient Interpreter Needed:  None Criminal Activity/Legal Involvement Pertinent to Current Situation/Hospitalization:  No - Comment as needed Significant Relationships:  None Lives with:  Other (Comment) (Homeless) Do you feel safe going back to the place where you live?    Need for family participation in patient care:  No (Coment)  Care giving concerns:  Patient reported that he does not have a PCP and has issues obtaining medications, care management has been consulted.    Social Worker assessment / plan:  CSW spoke with patient at bedside regarding consult for substance abuse. Patient reported that he is not addicted to anything and is not currently using any drugs. Patient reported that he uses pain medication and declined to speak about substance abuse any further. CSW inquired about how CSW could assist patient. Patient reported that he just needs assistance with getting medications and establishing care with a doctor. CSW notified patient that RNCM had been consulted and CSW was unaware if patient would be seen in the ED or after he is admitted at [facility]. Patient reported that he may just leave and go to [facility], due to the wait. CSW inquired about how patient planned to get to [facility], patient reported he was unsure. Patient reported that he knows that [facility] isn't full. CSW encouraged patient to follow up with staff at desired hospital about  needs if he decides to leave AMA.   CSW and patient discussed patient's goals moving forward. Patient reported that he has applied for Medicaid and Disability, noting he is working on getting housing. Patient reported that he lives in [place] and is interested in information about the [employer]. CSW provided requested information. CSW inquired if patient needed any additional resources, patient reported none at this time.   Employment status:  Disabled (Comment on whether or not currently receiving Disability) (Patient reported that he has applied for disability) Insurance information:  Medicaid In [place] PT Recommendations:  Not assessed at this time Information / Referral to community resources:  Other (Comment Required) Engineer, petroleum)  Patient/Family's Response to care:  Patient appreciative of resources provided by CSW.  Patient/Family's Understanding of and Emotional Response to Diagnosis, Current Treatment, and Prognosis:  Patient presented guarded throughout assessment with goal oriented speech. Patient did not verbalize understanding of diagnosis and is leaving AMA due to wait.   Emotional Assessment Appearance:  Appears older than stated age Attitude/Demeanor/Rapport:  Guarded Affect (typically observed):  Agitated Orientation:  Oriented to Self, Oriented to Place, Oriented to  Time, Oriented to Situation Alcohol / Substance use:  Illicit Drugs Psych involvement (Current and /or in the community):  No (Comment)  Discharge Needs  Concerns to be addressed:  Medication Concerns Readmission within the last 30 days:  No Current discharge risk:  None  Barriers to Discharge:  No Barriers Identified   Antionette Poles, LCSW 07/14/2017, 10:36 AM

## 2017-07-14 NOTE — ED Notes (Signed)
Pt agitated stating he wants to leave, but wants help too. Pt pacing and reports that he needs to "walk around". Pt advised that he may not go outside. [place] walked with pt outside and advised him that he may not go out alone. Pt also questioned if he may drive himself to [facility] would he get a room quicker. Pt advised that he does not have a room and driving over will not help. Pt agitated and wanting to leave.

## 2017-07-14 NOTE — Discharge Summary (Signed)
Physician Discharge Summary  Drury Ardizzone ZOX:[id number] DOB: 1986-09-15 DOA: 07/13/2017  PCP: Default, Provider, MD  Admit date: 07/13/2017 Discharge date: 07/14/2017  Admitted From: Home Disposition:  Left against medical advice   Discharge Condition: Poor prognosis due to medical noncompliance, hx of leaving AMA at [facility] as well as leaving AMA today  CODE STATUS: Full   Brief/Interim Summary: HPI by Dr. Antionette Char: Erik [name] is a 31 y.o. male with medical history significant for IV drug abuse, tricuspid valve vegetation, anemia, and kidney disease, presented to the emergency department for evaluation of swelling involving the legs and scrotum, as well as fatigue and malaise. Patient was admitted to [facility] at [facility] last month, found to have a tricuspid valve vegetation with blood cultures positive for MSSA. He was treated with oxacillin and scheduled for valve replacement, but left the hospital AMA prior to surgery. He later returned and was admitted again last month with his creatinine, recently normal,elevated to 8.4. He was treated with IV antibiotics and supportive care, creatinine improved to 6.65 without dialysis, and the patient again left AMA on 06/23/2017. He was given a prescription for Augmentin when he left. He denies any recent fevers or chills, denies any significant cough, but reports worsening exertional dyspnea, fatigue, bilateral lower extremity swelling, and marked edema involving his scrotum. He was also suspected of having septic pulmonary emboli during the recent admission. He had back pain that was evaluated with MRI without clear infectious process.  ED Course: Upon arrival to the ED, patient is found to be afebrile, saturating well on room air, hypertensive to 170/125, and with vitals otherwise stable.EKG features a normal sinus rhythm and chest x-ray is notable for mild cardiomegaly without overt edema, and patchy peripheral lower lung  opacities, possibly reflecting foci of infection or inflammation. Chemistry panel features a sodium of 134, BUN of 31, and creatinine of 3.58, improved from 6.65 at the end of July 2018. Albumin is 2.2. CBC is notable for a hemoglobin of 6.8, down from 7.3 when he left the hospital AMA at the end of July. He remained hemodynamically stable and in no apparent respiratory distress in the ED and will be admitted to the telemetry unit at [facility] for ongoing evaluation and management of symptomatic anemia, kidney disease, and edema.  Interim: He was evaluated; he continued to refuse surgical intervention and stated that he "just wants to feel better and go home." He wanted to get medications to fix his medical problems and go home. He continued to repeat that he was "not interested in surgery right now." He did receive 1u pRBC in the emergency department. He complained of the discomfort on being on the stretcher, complained of swelling. He denied any fevers, chills, chest pain. Had some shortness of breath. No nausea, vomiting, abdominal pain. I consulted cardiology in the morning; however, patient decided to leave against medical advice soon after my evaluation and stated that he was going to seek care at [facility]. AMA paperwork reviewed and signed.   Discharge Diagnoses:  Principal Problem:   Symptomatic anemia Active Problems:   IV drug abuse   Tricuspid valve vegetation   Endocarditis of tricuspid valve   Normocytic anemia   Kidney disease   Anasarca   Hypertensive urgency  1. Symptomatic anemia  - Hgb is 6.8 on admission, down from 7.3 on July 29, and down from 9.3 earlier in July  - Denies melena or hematochezia, no bleeding evident  - 1u pRBC  administered  2. Peripheral edema  - Likely multifactorial; albumin only 2.2, has severely regurgitant tricuspid valve, and poor renal fxn  - Supportive care with scrotal support, elevate legs    3. Tricuspid vegetation and severe regurg,  with recent hx of tricuspid valve vegetation and MSSA bacteremia, endocarditis  - Pt noted to have severe tricuspid regurgitation and 1.2 x 1.6 cm mass/vegetation on the valve on echo at [facility] from 06/23/17  - He was scheduled for valve replacement at [facility] last month, but left AMA prior to surgery  - He is currently refusing surgical intervention  - Consulted cardiology for aid with medical management, but patient left AMA prior to being evaluated   4. 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 Hypertension with hypertensive urgency  - DBP persistently elevated >100 in ED  - Use hydralazine IVP's for now    6. Drug abuse  - Pt reports that he has stopped using IV route - UDS positive for opiates this admission   7. Medical noncompliance - Has signed out AMA multiple times at [facility] and has decided to again sign out AMA this hospitalization after only about 12 hours of admission    Discharge Instructions   Allergies as of 07/14/2017      Reactions   Codeine Nausea And Vomiting   Levetiracetam Other (See Comments)   "said he became severely ill and angry mood swings" MD stopped medication   Neurontin [gabapentin] Other (See Comments)   "Feels out of it"   Red Dye    Red dye #3   Vicodin [hydrocodone-acetaminophen] Itching, Rash    Med Rec not completed as patient left AMA      Allergies  Allergen Reactions  . Codeine Nausea And Vomiting  . Levetiracetam Other (See Comments)    "said he became severely ill and angry mood swings" MD stopped medication  . Neurontin [Gabapentin] Other (See Comments)    "Feels out of it"  . Red Dye     Red dye #3  . Vicodin  [Hydrocodone-Acetaminophen] Itching and Rash    Consultations:  Cardiology, patient left AMA prior to their evaluation    Procedures/Studies: Dg Chest 2 View  Result Date: 07/13/2017 CLINICAL DATA:  Generalized edema EXAM: CHEST  2 VIEW COMPARISON:  05/29/2017 FINDINGS: Mild cardiomegaly. Decreased left upper lobe opacity with minimal residual, likely representing small focus of scarring. No significant pleural effusion. Patchy peripheral pulmonary opacity bilateral lower lung zones. No pneumothorax. IMPRESSION: 1. Mild cardiomegaly without overt edema 2. Further decrease in size of left upper lobe opacity with small focus of scar present 3. Patchy peripheral lower lung pulmonary opacity may reflect foci of infection or inflammation Electronically Signed   By: Jasmine Pang M.D.   On: 07/13/2017 18:45       Discharge Exam: Vitals:   07/14/17 0650 07/14/17 0910  BP: (!) 164/113 (!) 184/129  Pulse:  78  Resp: (!) 0 16  Temp:    SpO2:  100%   Vitals:   07/14/17 0640 07/14/17 0645 07/14/17 0650 07/14/17 0910  BP: (!) 165/114 (!) 165/114 (!) 164/113 (!) 184/129  Pulse:  78  Resp: 18 (!) 21 (!) 0 16  Temp:      TempSrc:      SpO2:    100%  Weight:      Height:        General: Pt is alert, very agitated, upset, angry, anxious  Cardiovascular: S1/S2 +, holosystolic murmur, bilateral pedal edema as well as large scrotal edema  Respiratory: +crackles bibasilar, no respiratory distress Abdominal: Soft, NT, ND, bowel sounds + Extremities: +2 bilateral pedal edema, no cyanosis    The results of significant diagnostics from this hospitalization (including imaging, microbiology, ancillary and laboratory) are listed below for reference.     Microbiology: No results found for this or any previous visit (from the past 240 hour(s)).   Labs: BNP (last 3 results)  Recent Labs  07/13/17 1806  BNP 474.5*   Basic Metabolic Panel:  Recent Labs Lab 07/13/17 1806 07/14/17 0500   NA 134* 137  K 3.5 3.7  CL 105 110  CO2 19* 19*  GLUCOSE 101* 99  BUN 31* 34*  CREATININE 3.58* 3.60*  CALCIUM 7.2* 7.3*   Liver Function Tests:  Recent Labs Lab 07/13/17 1806 07/14/17 0500  AST 21 23  ALT 18 18  ALKPHOS 113 109  BILITOT 0.3 0.5  PROT 7.7 7.3  ALBUMIN 2.2* 2.2*    Recent Labs Lab 07/13/17 1806  LIPASE 21   No results for input(s): AMMONIA in the last 168 hours. CBC:  Recent Labs Lab 07/13/17 1806 07/14/17 0845  WBC 6.2 6.3  NEUTROABS 4.2  --   HGB 6.8* 8.3*  HCT 20.5* 24.9*  MCV 88.0 85.6  PLT PLATELET CLUMPS NOTED ON SMEAR, UNABLE TO ESTIMATE PLATELET CLUMPS NOTED ON SMEAR, UNABLE TO ESTIMATE   Cardiac Enzymes: No results for input(s): CKTOTAL, CKMB, CKMBINDEX, TROPONINI in the last 168 hours. BNP: Invalid input(s): POCBNP CBG: No results for input(s): GLUCAP in the last 168 hours. D-Dimer No results for input(s): DDIMER in the last 72 hours. Hgb A1c No results for input(s): HGBA1C in the last 72 hours. Lipid Profile No results for input(s): CHOL, HDL, LDLCALC, TRIG, CHOLHDL, LDLDIRECT in the last 72 hours. Thyroid function studies No results for input(s): TSH, T4TOTAL, T3FREE, THYROIDAB in the last 72 hours.  Invalid input(s): FREET3 Anemia work up  Recent Labs  07/13/17 2217  VITAMINB12 296  FOLATE 14.5  FERRITIN 152  TIBC 224*  IRON 30*  RETICCTPCT 3.7*   Urinalysis    Component Value Date/Time   COLORURINE RED (A) 07/13/2017 1807   APPEARANCEUR CLEAR 07/13/2017 1807   LABSPEC 1.008 07/13/2017 1807   PHURINE 6.0 07/13/2017 1807   GLUCOSEU 50 (A) 07/13/2017 1807   HGBUR LARGE (A) 07/13/2017 1807   BILIRUBINUR NEGATIVE 07/13/2017 1807   KETONESUR NEGATIVE 07/13/2017 1807   PROTEINUR 100 (A) 07/13/2017 1807   UROBILINOGEN 0.2 02/28/2015 0042   NITRITE NEGATIVE 07/13/2017 1807   LEUKOCYTESUR NEGATIVE 07/13/2017 1807   Sepsis Labs Invalid input(s): PROCALCITONIN,  WBC,  LACTICIDVEN Microbiology No results found  for this or any previous visit (from the past 240 hour(s)).   SIGNED:  Noralee Stain, DO [facility] Pager [phone]  If 7PM-7AM, please contact night-coverage [email or web address] Password [facility] 07/14/2017, 10:38 AM

## 2017-07-14 NOTE — ED Notes (Signed)
Bed: WA17 Expected date:  Expected time:  Means of arrival:  Comments: Room 6 move pt.

## 2017-07-14 NOTE — ED Notes (Signed)
Pt refuses to keep BP cuff or EKG leads on. Pt removed all and put clothes on. Pt ambulating in room and hallway

## 2017-07-15 LAB — TYPE AND SCREEN
ABO/RH(D): O POS
ANTIBODY SCREEN: NEGATIVE
Unit division: 0

## 2017-07-15 LAB — BPAM RBC
Blood Product Expiration Date: 201809172359
ISSUE DATE / TIME: 201808190345
UNIT TYPE AND RH: 5100

## 2017-07-19 LAB — CULTURE, BLOOD (ROUTINE X 2)
Culture: NO GROWTH
Culture: NO GROWTH
SPECIAL REQUESTS: ADEQUATE
SPECIAL REQUESTS: ADEQUATE

## 2017-12-13 ENCOUNTER — Encounter (HOSPITAL_COMMUNITY): Payer: Self-pay

## 2017-12-13 ENCOUNTER — Other Ambulatory Visit: Payer: Self-pay

## 2017-12-13 ENCOUNTER — Emergency Department (HOSPITAL_COMMUNITY): Payer: Self-pay

## 2017-12-13 ENCOUNTER — Inpatient Hospital Stay (HOSPITAL_COMMUNITY)
Admission: EM | Admit: 2017-12-13 | Discharge: 2017-12-27 | DRG: 871 | Disposition: E | Payer: Self-pay | Attending: Internal Medicine | Admitting: Internal Medicine

## 2017-12-13 DIAGNOSIS — K219 Gastro-esophageal reflux disease without esophagitis: Secondary | ICD-10-CM | POA: Diagnosis present

## 2017-12-13 DIAGNOSIS — R7881 Bacteremia: Secondary | ICD-10-CM

## 2017-12-13 DIAGNOSIS — F329 Major depressive disorder, single episode, unspecified: Secondary | ICD-10-CM | POA: Diagnosis present

## 2017-12-13 DIAGNOSIS — F1910 Other psychoactive substance abuse, uncomplicated: Secondary | ICD-10-CM

## 2017-12-13 DIAGNOSIS — E872 Acidosis: Secondary | ICD-10-CM | POA: Diagnosis present

## 2017-12-13 DIAGNOSIS — N179 Acute kidney failure, unspecified: Secondary | ICD-10-CM

## 2017-12-13 DIAGNOSIS — Z515 Encounter for palliative care: Secondary | ICD-10-CM | POA: Diagnosis not present

## 2017-12-13 DIAGNOSIS — I130 Hypertensive heart and chronic kidney disease with heart failure and stage 1 through stage 4 chronic kidney disease, or unspecified chronic kidney disease: Secondary | ICD-10-CM | POA: Diagnosis present

## 2017-12-13 DIAGNOSIS — A419 Sepsis, unspecified organism: Secondary | ICD-10-CM

## 2017-12-13 DIAGNOSIS — I330 Acute and subacute infective endocarditis: Secondary | ICD-10-CM | POA: Diagnosis present

## 2017-12-13 DIAGNOSIS — R6521 Severe sepsis with septic shock: Secondary | ICD-10-CM | POA: Diagnosis present

## 2017-12-13 DIAGNOSIS — N184 Chronic kidney disease, stage 4 (severe): Secondary | ICD-10-CM | POA: Diagnosis present

## 2017-12-13 DIAGNOSIS — G8929 Other chronic pain: Secondary | ICD-10-CM | POA: Diagnosis present

## 2017-12-13 DIAGNOSIS — T68XXXA Hypothermia, initial encounter: Secondary | ICD-10-CM

## 2017-12-13 DIAGNOSIS — Z789 Other specified health status: Secondary | ICD-10-CM

## 2017-12-13 DIAGNOSIS — J9600 Acute respiratory failure, unspecified whether with hypoxia or hypercapnia: Secondary | ICD-10-CM

## 2017-12-13 DIAGNOSIS — E875 Hyperkalemia: Secondary | ICD-10-CM

## 2017-12-13 DIAGNOSIS — Z9119 Patient's noncompliance with other medical treatment and regimen: Secondary | ICD-10-CM

## 2017-12-13 DIAGNOSIS — B182 Chronic viral hepatitis C: Secondary | ICD-10-CM | POA: Diagnosis present

## 2017-12-13 DIAGNOSIS — G9340 Encephalopathy, unspecified: Secondary | ICD-10-CM | POA: Diagnosis present

## 2017-12-13 DIAGNOSIS — Z86711 Personal history of pulmonary embolism: Secondary | ICD-10-CM

## 2017-12-13 DIAGNOSIS — B958 Unspecified staphylococcus as the cause of diseases classified elsewhere: Secondary | ICD-10-CM

## 2017-12-13 DIAGNOSIS — B9562 Methicillin resistant Staphylococcus aureus infection as the cause of diseases classified elsewhere: Secondary | ICD-10-CM

## 2017-12-13 DIAGNOSIS — Z66 Do not resuscitate: Secondary | ICD-10-CM | POA: Diagnosis not present

## 2017-12-13 DIAGNOSIS — I50812 Chronic right heart failure: Secondary | ICD-10-CM | POA: Diagnosis present

## 2017-12-13 DIAGNOSIS — R0902 Hypoxemia: Secondary | ICD-10-CM

## 2017-12-13 DIAGNOSIS — A4101 Sepsis due to Methicillin susceptible Staphylococcus aureus: Principal | ICD-10-CM | POA: Diagnosis present

## 2017-12-13 DIAGNOSIS — F17210 Nicotine dependence, cigarettes, uncomplicated: Secondary | ICD-10-CM | POA: Diagnosis present

## 2017-12-13 DIAGNOSIS — E871 Hypo-osmolality and hyponatremia: Secondary | ICD-10-CM

## 2017-12-13 DIAGNOSIS — N19 Unspecified kidney failure: Secondary | ICD-10-CM

## 2017-12-13 DIAGNOSIS — Z95828 Presence of other vascular implants and grafts: Secondary | ICD-10-CM

## 2017-12-13 DIAGNOSIS — R740 Nonspecific elevation of levels of transaminase and lactic acid dehydrogenase [LDH]: Secondary | ICD-10-CM

## 2017-12-13 DIAGNOSIS — D696 Thrombocytopenia, unspecified: Secondary | ICD-10-CM

## 2017-12-13 DIAGNOSIS — R7401 Elevation of levels of liver transaminase levels: Secondary | ICD-10-CM

## 2017-12-13 LAB — COMPREHENSIVE METABOLIC PANEL
ALT: 139 U/L — AB (ref 17–63)
ANION GAP: 25 — AB (ref 5–15)
AST: 356 U/L — ABNORMAL HIGH (ref 15–41)
Albumin: 2.1 g/dL — ABNORMAL LOW (ref 3.5–5.0)
Alkaline Phosphatase: 170 U/L — ABNORMAL HIGH (ref 38–126)
BUN: 183 mg/dL — ABNORMAL HIGH (ref 6–20)
CHLORIDE: 85 mmol/L — AB (ref 101–111)
CO2: 12 mmol/L — AB (ref 22–32)
Calcium: 7.5 mg/dL — ABNORMAL LOW (ref 8.9–10.3)
Creatinine, Ser: 8.01 mg/dL — ABNORMAL HIGH (ref 0.61–1.24)
GFR calc non Af Amer: 8 mL/min — ABNORMAL LOW (ref >60)
GFR, EST AFRICAN AMERICAN: 9 mL/min — AB (ref >60)
GLUCOSE: 89 mg/dL (ref 65–99)
Potassium: 5.7 mmol/L — ABNORMAL HIGH (ref 3.5–5.1)
SODIUM: 122 mmol/L — AB (ref 135–145)
Total Bilirubin: 3.8 mg/dL — ABNORMAL HIGH (ref 0.3–1.2)
Total Protein: 7.4 g/dL (ref 6.5–8.1)

## 2017-12-13 LAB — CBC WITH DIFFERENTIAL/PLATELET
BASOS PCT: 0 %
Basophils Absolute: 0 10*3/uL (ref 0.0–0.1)
EOS PCT: 0 %
Eosinophils Absolute: 0 10*3/uL (ref 0.0–0.7)
HEMATOCRIT: 35.4 % — AB (ref 39.0–52.0)
HEMOGLOBIN: 12.2 g/dL — AB (ref 13.0–17.0)
Lymphocytes Relative: 5 %
Lymphs Abs: 2.4 10*3/uL (ref 0.7–4.0)
MCH: 28.2 pg (ref 26.0–34.0)
MCHC: 34.5 g/dL (ref 30.0–36.0)
MCV: 81.8 fL (ref 78.0–100.0)
MONOS PCT: 2 %
Monocytes Absolute: 1 10*3/uL (ref 0.1–1.0)
NEUTROS PCT: 93 %
Neutro Abs: 44.1 10*3/uL — ABNORMAL HIGH (ref 1.7–7.7)
PLATELETS: 71 10*3/uL — AB (ref 150–400)
RBC: 4.33 MIL/uL (ref 4.22–5.81)
RDW: 17.6 % — ABNORMAL HIGH (ref 11.5–15.5)
WBC: 47.5 10*3/uL — ABNORMAL HIGH (ref 4.0–10.5)

## 2017-12-13 LAB — I-STAT CHEM 8, ED
BUN: >140 mg/dL — ABNORMAL HIGH (ref 6–20)
CALCIUM ION: 0.85 mmol/L — AB (ref 1.15–1.40)
CHLORIDE: 90 mmol/L — AB (ref 101–111)
Creatinine, Ser: 8.4 mg/dL — ABNORMAL HIGH (ref 0.61–1.24)
Glucose, Bld: 94 mg/dL (ref 65–99)
HEMATOCRIT: 41 % (ref 39.0–52.0)
Hemoglobin: 13.9 g/dL (ref 13.0–17.0)
POTASSIUM: 5.6 mmol/L — AB (ref 3.5–5.1)
SODIUM: 121 mmol/L — AB (ref 135–145)
TCO2: 13 mmol/L — ABNORMAL LOW (ref 22–32)

## 2017-12-13 LAB — CK: CK TOTAL: 90 U/L (ref 49–397)

## 2017-12-13 LAB — ETHANOL: Alcohol, Ethyl (B): <10 mg/dL (ref <10)

## 2017-12-13 LAB — TYPE AND SCREEN
ABO/RH(D): O POS
Antibody Screen: NEGATIVE

## 2017-12-13 LAB — INFLUENZA PANEL BY PCR (TYPE A & B)
INFLAPCR: NEGATIVE
Influenza B By PCR: NEGATIVE

## 2017-12-13 LAB — LACTATE DEHYDROGENASE: LDH: 446 U/L — ABNORMAL HIGH (ref 98–192)

## 2017-12-13 LAB — I-STAT TROPONIN, ED: Troponin i, poc: 0.05 ng/mL (ref 0.00–0.08)

## 2017-12-13 LAB — URIC ACID: URIC ACID, SERUM: 20.7 mg/dL — AB (ref 4.4–7.6)

## 2017-12-13 LAB — PROTIME-INR
INR: 2.63
Prothrombin Time: 27.9 seconds — ABNORMAL HIGH (ref 11.4–15.2)

## 2017-12-13 LAB — SALICYLATE LEVEL

## 2017-12-13 LAB — BRAIN NATRIURETIC PEPTIDE: B Natriuretic Peptide: 1037.9 pg/mL — ABNORMAL HIGH (ref 0.0–100.0)

## 2017-12-13 LAB — I-STAT CG4 LACTIC ACID, ED: Lactic Acid, Venous: 5.32 mmol/L (ref 0.5–1.9)

## 2017-12-13 LAB — ACETAMINOPHEN LEVEL

## 2017-12-13 LAB — FIBRINOGEN: FIBRINOGEN: 312 mg/dL (ref 210–475)

## 2017-12-13 MED ORDER — SODIUM CHLORIDE 0.9 % FOR CRRT
INTRAVENOUS_CENTRAL | Status: DC | PRN
  Filled 2017-12-13: qty 1000

## 2017-12-13 MED ORDER — VANCOMYCIN HCL 500 MG IV SOLR
500.0000 mg | Freq: Once | INTRAVENOUS | Status: AC
  Administered 2017-12-14: 500 mg via INTRAVENOUS
  Filled 2017-12-13: qty 500

## 2017-12-13 MED ORDER — SODIUM CHLORIDE 0.9 % IV BOLUS (SEPSIS)
1000.0000 mL | Freq: Once | INTRAVENOUS | Status: AC
Start: 2017-12-13 — End: 2017-12-13
  Administered 2017-12-13: 1000 mL via INTRAVENOUS

## 2017-12-13 MED ORDER — VANCOMYCIN HCL IN DEXTROSE 1-5 GM/200ML-% IV SOLN
1000.0000 mg | Freq: Once | INTRAVENOUS | Status: AC
  Administered 2017-12-13: 1000 mg via INTRAVENOUS
  Filled 2017-12-13: qty 200

## 2017-12-13 MED ORDER — SODIUM CHLORIDE 0.9 % IV BOLUS (SEPSIS)
1000.0000 mL | Freq: Once | INTRAVENOUS | Status: AC
  Administered 2017-12-13: 1000 mL via INTRAVENOUS

## 2017-12-13 MED ORDER — PIPERACILLIN-TAZOBACTAM 3.375 G IVPB 30 MIN
3.3750 g | Freq: Once | INTRAVENOUS | Status: AC
  Administered 2017-12-13: 3.375 g via INTRAVENOUS
  Filled 2017-12-13: qty 50

## 2017-12-13 MED ORDER — INSULIN ASPART 100 UNIT/ML ~~LOC~~ SOLN: 0.0000 [IU] | SUBCUTANEOUS | Status: DC

## 2017-12-13 MED ORDER — FENTANYL CITRATE (PF) 100 MCG/2ML IJ SOLN: 50.0000 ug | Freq: Once | INTRAMUSCULAR | Status: DC

## 2017-12-13 MED ORDER — HEPARIN SODIUM (PORCINE) 5000 UNIT/ML IJ SOLN
5000.0000 [IU] | Freq: Three times a day (TID) | INTRAMUSCULAR | Status: DC
  Administered 2017-12-14: 5000 [IU] via SUBCUTANEOUS
  Filled 2017-12-13 (×2): qty 1

## 2017-12-13 MED ORDER — PANTOPRAZOLE SODIUM 40 MG IV SOLR: 40.0000 mg | Freq: Every day | INTRAVENOUS | Status: DC

## 2017-12-13 MED ORDER — SODIUM CHLORIDE 0.9 % IJ SOLN
250.0000 [IU]/h | INTRAMUSCULAR | Status: DC
  Administered 2017-12-14: 350 [IU]/h via INTRAVENOUS_CENTRAL
  Administered 2017-12-14: 800 [IU]/h via INTRAVENOUS_CENTRAL
  Administered 2017-12-14: 600 [IU]/h via INTRAVENOUS_CENTRAL
  Administered 2017-12-14: 700 [IU]/h via INTRAVENOUS_CENTRAL
  Administered 2017-12-14 (×2): 950 [IU]/h via INTRAVENOUS_CENTRAL
  Administered 2017-12-14: 900 [IU]/h via INTRAVENOUS_CENTRAL
  Administered 2017-12-14: 250 [IU]/h via INTRAVENOUS_CENTRAL
  Administered 2017-12-14: 450 [IU]/h via INTRAVENOUS_CENTRAL
  Administered 2017-12-14: 550 [IU]/h via INTRAVENOUS_CENTRAL
  Administered 2017-12-14: 950 [IU]/h via INTRAVENOUS_CENTRAL
  Filled 2017-12-13 (×3): qty 2

## 2017-12-13 MED ORDER — HEPARIN BOLUS VIA INFUSION (CRRT)
1000.0000 [IU] | INTRAVENOUS | Status: DC | PRN
  Filled 2017-12-13: qty 1000

## 2017-12-13 MED ORDER — PRISMASOL BGK 4/2.5 32-4-2.5 MEQ/L IV SOLN
INTRAVENOUS | Status: DC
  Filled 2017-12-13 (×4): qty 5000

## 2017-12-13 MED ORDER — HEPARIN SODIUM (PORCINE) 1000 UNIT/ML DIALYSIS
1000.0000 [IU] | INTRAMUSCULAR | Status: DC | PRN
  Administered 2017-12-14: 1000 [IU] via INTRAVENOUS_CENTRAL
  Filled 2017-12-13 (×2): qty 6

## 2017-12-13 MED ORDER — PRISMASOL BGK 4/2.5 32-4-2.5 MEQ/L IV SOLN
INTRAVENOUS | Status: DC
  Filled 2017-12-13 (×2): qty 5000

## 2017-12-13 MED ORDER — DEXTROSE 50 % IV SOLN
25.0000 mL | Freq: Once | INTRAVENOUS | Status: AC
  Administered 2017-12-13 – 2017-12-14 (×2): 25 mL via INTRAVENOUS
  Filled 2017-12-13: qty 50

## 2017-12-13 MED ORDER — LORAZEPAM 2 MG/ML IJ SOLN
1.0000 mg | Freq: Once | INTRAMUSCULAR | Status: DC
  Filled 2017-12-13: qty 1

## 2017-12-13 MED ORDER — SODIUM CHLORIDE 0.9 % IV SOLN
250.0000 mL | INTRAVENOUS | Status: DC | PRN
  Administered 2017-12-14: 1000 mL via INTRAVENOUS

## 2017-12-13 MED ORDER — LACTATED RINGERS IV SOLN: INTRAVENOUS | Status: DC
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
## 2017-12-13 NOTE — Progress Notes (Signed)
Pharmacy Antibiotic Note  Erik [name] is a 32 y.o. male admitted on November 23, 2018 with severe sepsis and history of MSSA tricupsid valve endocarditis which was not fully treated due to noncompliance per chart review.  Pharmacy has been consulted for vancomycin and zosyn dosing empirically.  Hx of IVDU.  Presents with severe AKI. Nephrology consulted and placed orders to begin CRRT.  Patient received Vancomycin 1g and Zosyn 3.375g doses in ED.    Plan: Provide an additional dose of vancomycin now of 500 mg in addition to the 1g for a total 1500 mg loading dose. Will also hold off on placing orders for maintenance doses for vancomycin and Zosyn until timing of CRRT start determined.     Temp (24hrs), Avg:94.7 F (34.8 C), Min:93 F (33.9 C), Max:95.5 F (35.3 C)  Recent Labs  Lab November 23, 2018 1713 November 23, 2018 1750  WBC 47.5*  --   CREATININE 8.01* 8.40*  LATICACIDVEN  --  5.32*    CrCl cannot be calculated (Unknown ideal weight.).    Allergies  Allergen Reactions  . Codeine Nausea And Vomiting  . Levetiracetam Other (See Comments)    "said he became severely ill and angry mood swings" MD stopped medication  . Neurontin [Gabapentin] Other (See Comments)    "Feels out of it"  . Red Dye     Red dye #3  . Vicodin [Hydrocodone-Acetaminophen] Itching and Rash    Antimicrobials this admission:  1/18 Vanc 1/18 Zosyn  Dose adjustments this admission:   Microbiology results:  1/18 BCx:  Thank you for allowing pharmacy to be a part of this patient's care.  Clance BollRunyon, Aaronjames Kelsay November 23, 2018 8:46 PM

## 2017-12-13 NOTE — Progress Notes (Signed)
Have attempted to do nursing admission history x 2. Pt is uncooperative with staff. Charge rn asked me on earlier attempt not to speak with pt. ER PA asked me not to disturb pt at current time. Erik [name]. Carleane Joshlyn Beadle BSN, RN-BC Admissions RN 12/15/2017 7:48 PM

## 2017-12-13 NOTE — ED Notes (Signed)
Pt refusing vitals and temperature stating, "please let me be. Don't make me check out."

## 2017-12-13 NOTE — Progress Notes (Signed)
A consult was received from an ED physician for vancomycin and zosyn per pharmacy dosing.  The patient's profile has been reviewed for ht/wt/allergies/indication/available labs.   A one time order has been placed for vancomycin 1g and zosyn 3.375g.  Further antibiotics/pharmacy consults should be ordered by admitting physician if indicated.                       Thank you, Erik [name], Erik [name] 04-27-18  5:34 PM

## 2017-12-13 NOTE — H&P (Signed)
PULMONARY / CRITICAL CARE MEDICINE   Name: Refael Fulop MRN: [id number] DOB: 1986-02-19    ADMISSION DATE:  12-30-17  REFERRING MD:  Lyndel Safe  CHIEF COMPLAINT:  Altered MS, lethargy, "I feel terrible"   HISTORY OF PRESENT ILLNESS:   32 year old man with a history of drug abuse, MSSA tricuspid valve endocarditis, associated renal failure.  He left the hospital AGAINST MEDICAL ADVICE, has followed up at least intermittently at [facility].  He continues to use drugs, admitted that his last use was approximately 2 days ago.  He presented to the emergency department today with altered mental status, lethargy, "feeling terrible".  Found to have relative hypotension, acute chronic renal failure.  His lactic acid was elevated consistent with occult septic shock.  PAST MEDICAL HISTORY :  He  has a past medical history of Anxiety, Arthritis, CHF (congestive heart failure) (HCC), Depression, GERD (gastroesophageal reflux disease), Hypertension, Migraine, Migraine, Neck pain, and Renal disorder.  PAST SURGICAL HISTORY: He  has a past surgical history that includes Appendectomy and Wisdom tooth extraction.  Allergies  Allergen Reactions  . Codeine Nausea And Vomiting  . Levetiracetam Other (See Comments)    "said he became severely ill and angry mood swings" MD stopped medication  . Neurontin [Gabapentin] Other (See Comments)    "Feels out of it"  . Red Dye     Red dye #3  . Vicodin [Hydrocodone-Acetaminophen] Itching and Rash    No current facility-administered medications on file prior to encounter.    No current outpatient medications on file prior to encounter.    FAMILY HISTORY:  His indicated that his mother is alive. He indicated that his father is alive.   SOCIAL HISTORY: He  reports that he has been smoking.  He has been smoking about 1.00 pack per day. he has never used smokeless tobacco. He reports that he drinks alcohol. He reports that he uses drugs.  REVIEW  OF SYSTEMS:   As above  SUBJECTIVE:  Has a HA, "feels terrible"   VITAL SIGNS: BP (!) 101/56   Pulse 63   Temp (!) 95.5 F (35.3 C) (Oral)   Resp (!) 22   SpO2 100%   HEMODYNAMICS:    VENTILATOR SETTINGS:    INTAKE / OUTPUT: No intake/output data recorded.  PHYSICAL EXAMINATION: General: Ill-appearing young man Neuro: Awake and will interact but lethargic, nonfocal HEENT: Oropharynx dry, pupils equal Cardiovascular: Regular, borderline bradycardia, 3 out of 6 systolic murmur Lungs: Coarse bilaterally Abdomen: Soft, nontender, positive bowel sounds Musculoskeletal: No deformities, lower extremities are painful to palpation I do not feel any subcutaneous gas Skin: He has Janeway lesions on his hands and his toes, evidence of petechiae on his bilateral shins.  No overt cellulitis  LABS:  BMET Recent Labs  Lab 12/30/2017 1750  NA 121*  K 5.6*  CL 90*  BUN >140*  CREATININE 8.40*  GLUCOSE 94    Electrolytes No results for input(s): CALCIUM, MG, PHOS in the last 168 hours.  CBC Recent Labs  Lab 12-30-17 1750  HGB 13.9  HCT 41.0    Coag's No results for input(s): APTT, INR in the last 168 hours.  Sepsis Markers Recent Labs  Lab 30-Dec-2017 1750  LATICACIDVEN 5.32*    ABG No results for input(s): PHART, PCO2ART, PO2ART in the last 168 hours.  Liver Enzymes No results for input(s): AST, ALT, ALKPHOS, BILITOT, ALBUMIN in the last 168 hours.  Cardiac Enzymes No results for input(s): TROPONINI, PROBNP in the last  168 hours.  Glucose No results for input(s): GLUCAP in the last 168 hours.  Imaging Dg Chest Port 1 View  Result Date: 12/01/2017 CLINICAL DATA:  Cough with chest pain EXAM: PORTABLE CHEST 1 VIEW COMPARISON:  CT chest 08/08/2017, radiograph 08/15/2017 FINDINGS: Multiple bilateral cystic and cavitary lung lesions, increased compared to the prior radiograph. Mild cardiomegaly. No pleural effusion. No pneumothorax IMPRESSION: 1. Increased  bilateral cystic and cavitary lesions compared to prior radiograph and CT 2. Cardiomegaly Electronically Signed   By: Jasmine PangKim  Fujinaga M.D.   On: 12/05/2017 17:48     STUDIES:   CULTURES: Blood 1/18 Urine 1/18  ANTIBIOTICS: Vancomycin 1/18 Zosyn 1/18  SIGNIFICANT EVENTS:  LINES/TUBES:  DISCUSSION: 32 year old man with drug abuse, tricuspid MSSA native valve endocarditis with septic shock and acute on chronic renal failure.  ASSESSMENT / PLAN:  PULMONARY A: No acute issues P:   Pulmonary hygiene  CARDIOVASCULAR A:  Severe sepsis, at risk for shock Native valve tricuspid MSSA endocarditis, noncompliant has not been fully treated P:  Volume resuscitation now initiated Follow lactate for clearance Repeat echocardiogram Unclear at what point he would be a candidate to discuss possible valvular replacement.  He would need to be clean from illicit substances  RENAL A:   Acute on chronic renal failure presumed ATN, consider embolic injury Mild hyperkalemia P:   Follow acidosis, BMP for improvement with volume resuscitation We will consult renal to assist with his care  GASTROINTESTINAL A:   SUP P:   PPI  HEMATOLOGIC A:   Petechiae on bilateral lower extremities P:  DIC panel pending, follow CBC  INFECTIOUS A:   Severe sepsis MSSA tricuspid valve endocarditis P:   Blood cultures drawn and pending Repeat echocardiogram Treat with broad-spectrum antibiotics at least initially until we confirmed that this continues to be MSSA Check HIV  ENDOCRINE A:   At risk hyperglycemia P:   SSI   NEUROLOGIC A:   Lethargy, toxic metabolic P:   RASS goal: 0 Hold sedating medications   FAMILY  - Updates: None Present   Independent CC time 40 minutes  Levy Pupaobert Byrum, MD, PhD 11/29/2017, 6:32 PM [facility] [phone] or if no answer [phone]

## 2017-12-13 NOTE — ED Notes (Signed)
Deterding, MD notified about pt refusing all interventions.

## 2017-12-13 NOTE — ED Notes (Signed)
PA at bedside.

## 2017-12-13 NOTE — ED Notes (Signed)
Pt still refusing to cooperate with staff for any procedures. This RN went and spoke with pt and he is refusing all interventions. He stated, "I don't want to be bothered now, leave me alone."

## 2017-12-13 NOTE — ED Notes (Signed)
Pt refusing to keep bear hugger on or have rectal temp or V/S taken.

## 2017-12-13 NOTE — ED Triage Notes (Addendum)
Pt BIB [facility] c/o generalized pain and SOB x6 days. No audible wheezing or increased work of breathing noted, but pt is tachypnaeic. A&Ox4. He denies drug use, but is falling asleep during triage. Pt is pale, but states that he feels very hot.

## 2017-12-13 NOTE — ED Notes (Signed)
ICU doc at bedside.

## 2017-12-13 NOTE — Consult Note (Signed)
Reason for Consult:AKI/CKD 4 Referring Physician: Dr. Burna Forts Loftus is an 32 y.o. male.  HPI: 32 yr male with hx of polysubstance abuse including iv heroin, hx TV endocarditis with emboli, CHG, Depression, Sepsis, Migraines, chronic pain, recurrent nonadherence, HTN, who has been seen at [facility] [employer], [facility], [facility], but apparently most recently at [facility].  Hx AKI in July of 2017 with Crs as high as 7s. Most recent appears to have plateaued in 3s.  Still using.  Progressive weakness, aching all over, N, V, chill, hiccups, itching , ms cramping SOB, and anorexia.  Now has Cr 8.4 BUN 183, K 5.6, Lactate level 5.3.  WBC 47.5.  ^ LFTs also (all not back). Has Hep C. Constitutional: as above Eyes: blurred vision Ears, nose, mouth, throat, and face: negative Respiratory: more SOB Cardiovascular: edema,  Gastrointestinal: as above Genitourinary:some prob urinating, straining and pain Integument/breast: negative, sores all over esp legs Hematologic/lymphatic: negative Musculoskeletal:diffuse aches Neurological: sleepy Behavioral/Psych: depression Allergic/Immunologic: codiene, levetiracetam   Past Medical History:  Diagnosis Date  . Anxiety   . Arthritis   . CHF (congestive heart failure) (HCC)   . Depression   . GERD (gastroesophageal reflux disease)   . Hypertension   . Migraine   . Migraine   . Neck pain   . Renal disorder     Past Surgical History:  Procedure Laterality Date  . APPENDECTOMY     09/05/2011  . WISDOM TOOTH EXTRACTION      History reviewed. No pertinent family history.  Social History:  reports that he has been smoking.  He has been smoking about 1.00 pack per day. he has never used smokeless tobacco. He reports that he drinks alcohol. He reports that he uses drugs.  Allergies:  Allergies  Allergen Reactions  . Codeine Nausea And Vomiting  . Levetiracetam Other (See Comments)    "said he became severely ill and angry mood swings" MD stopped medication   . Neurontin [Gabapentin] Other (See Comments)    "Feels out of it"  . Red Dye     Red dye #3  . Vicodin [Hydrocodone-Acetaminophen] Itching and Rash    Medications: I have reviewed the patient's current medications.  Results for orders placed or performed during the hospital encounter of 01-05-18 (from the past 48 hour(s))  Comprehensive metabolic panel     Status: Abnormal (Preliminary result)   Collection Time: 01/05/18  5:13 PM  Result Value Ref Range   Sodium PENDING 135 - 145 mmol/L   Potassium PENDING 3.5 - 5.1 mmol/L   Chloride PENDING 101 - 111 mmol/L   CO2 PENDING 22 - 32 mmol/L   Glucose, Bld PENDING 65 - 99 mg/dL   BUN 409 (H) 6 - 20 mg/dL    Comment: RESULTS CONFIRMED BY MANUAL DILUTION   Creatinine, Ser PENDING 0.61 - 1.24 mg/dL   Calcium PENDING 8.9 - 10.3 mg/dL   Total Protein PENDING 6.5 - 8.1 g/dL   Albumin PENDING 3.5 - 5.0 g/dL   AST 811 (H) 15 - 41 U/L    Comment: RESULTS CONFIRMED BY MANUAL DILUTION   ALT PENDING 17 - 63 U/L   Alkaline Phosphatase PENDING 38 - 126 U/L   Total Bilirubin PENDING 0.3 - 1.2 mg/dL   GFR calc non Af Amer PENDING >60 mL/min   GFR calc Af Amer PENDING >60 mL/min   Anion gap PENDING 5 - 15  CBC WITH DIFFERENTIAL     Status: Abnormal (Preliminary result)   Collection Time:  12/04/2017  5:13 PM  Result Value Ref Range   WBC 47.5 (H) 4.0 - 10.5 K/uL   RBC 4.33 4.22 - 5.81 MIL/uL   Hemoglobin 12.2 (L) 13.0 - 17.0 g/dL   HCT 16.1 (L) 09.6 - 04.5 %   MCV 81.8 78.0 - 100.0 fL   MCH 28.2 26.0 - 34.0 pg   MCHC 34.5 30.0 - 36.0 g/dL   RDW 40.9 (H) 81.1 - 91.4 %   Platelets PENDING 150 - 400 K/uL   Neutrophils Relative % PENDING %   Neutro Abs PENDING 1.7 - 7.7 K/uL   Band Neutrophils PENDING %   Lymphocytes Relative PENDING %   Lymphs Abs PENDING 0.7 - 4.0 K/uL   Monocytes Relative PENDING %   Monocytes Absolute PENDING 0.1 - 1.0 K/uL   Eosinophils Relative PENDING %   Eosinophils Absolute PENDING 0.0 - 0.7 K/uL   Basophils  Relative PENDING %   Basophils Absolute PENDING 0.0 - 0.1 K/uL   WBC Morphology PENDING    RBC Morphology PENDING    Smear Review PENDING    nRBC PENDING 0 /100 WBC   Metamyelocytes Relative PENDING %   Myelocytes PENDING %   Promyelocytes Absolute PENDING %   Blasts PENDING %  Protime-INR     Status: Abnormal   Collection Time: 12/17/2017  5:13 PM  Result Value Ref Range   Prothrombin Time 27.9 (H) 11.4 - 15.2 seconds   INR 2.63   Brain natriuretic peptide     Status: Abnormal   Collection Time: 11/27/2017  5:28 PM  Result Value Ref Range   B Natriuretic Peptide 1,037.9 (H) 0.0 - 100.0 pg/mL  Acetaminophen level     Status: Abnormal   Collection Time: 12/05/2017  5:28 PM  Result Value Ref Range   Acetaminophen (Tylenol), Serum <10 (L) 10 - 30 ug/mL    Comment:        THERAPEUTIC CONCENTRATIONS VARY SIGNIFICANTLY. A RANGE OF 10-30 ug/mL MAY BE AN EFFECTIVE CONCENTRATION FOR MANY PATIENTS. HOWEVER, SOME ARE BEST TREATED AT CONCENTRATIONS OUTSIDE THIS RANGE. ACETAMINOPHEN CONCENTRATIONS >150 ug/mL AT 4 HOURS AFTER INGESTION AND >50 ug/mL AT 12 HOURS AFTER INGESTION ARE OFTEN ASSOCIATED WITH TOXIC REACTIONS.   Salicylate level     Status: None   Collection Time: 12/21/2017  5:28 PM  Result Value Ref Range   Salicylate Lvl <7.0 2.8 - 30.0 mg/dL  Ethanol     Status: None   Collection Time: 12/07/2017  5:28 PM  Result Value Ref Range   Alcohol, Ethyl (B) <10 <10 mg/dL    Comment:        LOWEST DETECTABLE LIMIT FOR SERUM ALCOHOL IS 10 mg/dL FOR MEDICAL PURPOSES ONLY   CK     Status: None   Collection Time: 12/04/2017  5:38 PM  Result Value Ref Range   Total CK 90 49 - 397 U/L  Fibrinogen     Status: None   Collection Time: 12/19/2017  5:38 PM  Result Value Ref Range   Fibrinogen 312 210 - 475 mg/dL  I-stat troponin, ED     Status: None   Collection Time: 12/25/2017  5:48 PM  Result Value Ref Range   Troponin i, poc 0.05 0.00 - 0.08 ng/mL   Comment 3            Comment: Due to  the release kinetics of cTnI, a negative result within the first hours of the onset of symptoms does not rule out myocardial infarction with  certainty. If myocardial infarction is still suspected, repeat the test at appropriate intervals.   I-Stat CG4 Lactic Acid, ED  (not at  [facility])     Status: Abnormal   Collection Time: 04/10/2018  5:50 PM  Result Value Ref Range   Lactic Acid, Venous 5.32 (HH) 0.5 - 1.9 mmol/L   Comment NOTIFIED PHYSICIAN   I-stat Chem 8, ED     Status: Abnormal   Collection Time: 04/10/2018  5:50 PM  Result Value Ref Range   Sodium 121 (L) 135 - 145 mmol/L   Potassium 5.6 (H) 3.5 - 5.1 mmol/L   Chloride 90 (L) 101 - 111 mmol/L   BUN >140 (H) 6 - 20 mg/dL   Creatinine, Ser 1.618.40 (H) 0.61 - 1.24 mg/dL   Glucose, Bld 94 65 - 99 mg/dL   Calcium, Ion 0.960.85 (LL) 1.15 - 1.40 mmol/L   TCO2 13 (L) 22 - 32 mmol/L   Hemoglobin 13.9 13.0 - 17.0 g/dL   HCT 04.541.0 40.939.0 - 81.152.0 %   Comment NOTIFIED PHYSICIAN     Dg Chest Port 1 View  Result Date: 10/02/2018 CLINICAL DATA:  Cough with chest pain EXAM: PORTABLE CHEST 1 VIEW COMPARISON:  CT chest 08/08/2017, radiograph 08/15/2017 FINDINGS: Multiple bilateral cystic and cavitary lung lesions, increased compared to the prior radiograph. Mild cardiomegaly. No pleural effusion. No pneumothorax IMPRESSION: 1. Increased bilateral cystic and cavitary lesions compared to prior radiograph and CT 2. Cardiomegaly Electronically Signed   By: Jasmine PangKim  Fujinaga M.D.   On: 011/05/2018 17:48    ROS Blood pressure (!) 103/57, pulse 65, temperature (!) 95.5 F (35.3 C), temperature source Oral, resp. rate (!) 27, SpO2 95 %. Physical Exam Physical Examination: General appearance - uncomfortable, skin erosions and blisters diffusely Mental status - uncooperative, angry, aggitated Eyes - pupils equal and reactive, extraocular eye movements intact, cannot see Fundi well Ears - bilateral TM's and external ear canals normal Nose - normal and patent, no  erythema, discharge or polyps Mouth - mucous membranes moist, pharynx normal without lesions Neck - adenopathy noted A&P cervical Lymphatics - moderate nontender anterior cervical nodes, moderate tender anterior cervical nodes, hepatomegaly noted liver down 6 cm , splenomegaly noted LUQ Chest - clear to auscultation, no wheezes, rales or rhonchi, symmetric air entry, rales noted diffusely, rhonchi noted diffusely Heart - S1 and S2 normal, GR 2/6 holosys M Abdomen - tenderness noted diffusely hepatomegaly 6 cm below CM  MCL splenomegaly Extremities - pedal edema 2+ + Skin - erosions diffusely , legs with hemorrhagic blisters, embolic spots on hands, janeway lesions.   Assessment/Plan: 1 AKI  Infx, toxic , bp,  , ? IC .Marland Kitchen. Will need to investigate. Clearly ureimic, acidemic , hyperkalemic. Needs RRT and would do CRRT with soft bp and what  I suspect will be worsening clinical status.  Could be embolic dz to kidneys or immunologic.  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 2 CKD4 3 Hypertension: not an issue 4. Sepsis with endocarditis giving AB 5. Systemic emboli 6 Agitation and personality issues 7 Substance abuse P CRRt, check UA, immunlogic parameters, Hep status , HIV, u/s Fayrene FearingJames [address]ena [address] 10/02/2018, 8:06 PM

## 2017-12-13 NOTE — ED Notes (Signed)
Patient moved onto a hospital bed

## 2017-12-13 NOTE — ED Notes (Signed)
Lab called for flu swabs 

## 2017-12-14 ENCOUNTER — Inpatient Hospital Stay (HOSPITAL_COMMUNITY): Payer: Self-pay

## 2017-12-14 ENCOUNTER — Other Ambulatory Visit (HOSPITAL_COMMUNITY): Payer: Self-pay

## 2017-12-14 DIAGNOSIS — N19 Unspecified kidney failure: Secondary | ICD-10-CM

## 2017-12-14 DIAGNOSIS — I38 Endocarditis, valve unspecified: Secondary | ICD-10-CM

## 2017-12-14 DIAGNOSIS — B182 Chronic viral hepatitis C: Secondary | ICD-10-CM

## 2017-12-14 DIAGNOSIS — A4101 Sepsis due to Methicillin susceptible Staphylococcus aureus: Principal | ICD-10-CM

## 2017-12-14 DIAGNOSIS — Z888 Allergy status to other drugs, medicaments and biological substances status: Secondary | ICD-10-CM

## 2017-12-14 DIAGNOSIS — N179 Acute kidney failure, unspecified: Secondary | ICD-10-CM

## 2017-12-14 DIAGNOSIS — G9340 Encephalopathy, unspecified: Secondary | ICD-10-CM

## 2017-12-14 DIAGNOSIS — R0603 Acute respiratory distress: Secondary | ICD-10-CM

## 2017-12-14 DIAGNOSIS — N189 Chronic kidney disease, unspecified: Secondary | ICD-10-CM

## 2017-12-14 DIAGNOSIS — Z91041 Radiographic dye allergy status: Secondary | ICD-10-CM

## 2017-12-14 DIAGNOSIS — Z885 Allergy status to narcotic agent status: Secondary | ICD-10-CM

## 2017-12-14 DIAGNOSIS — A4153 Sepsis due to Serratia: Secondary | ICD-10-CM

## 2017-12-14 DIAGNOSIS — Z8619 Personal history of other infectious and parasitic diseases: Secondary | ICD-10-CM

## 2017-12-14 DIAGNOSIS — J9601 Acute respiratory failure with hypoxia: Secondary | ICD-10-CM

## 2017-12-14 DIAGNOSIS — F1190 Opioid use, unspecified, uncomplicated: Secondary | ICD-10-CM

## 2017-12-14 DIAGNOSIS — R6520 Severe sepsis without septic shock: Secondary | ICD-10-CM

## 2017-12-14 DIAGNOSIS — F17210 Nicotine dependence, cigarettes, uncomplicated: Secondary | ICD-10-CM

## 2017-12-14 LAB — RENAL FUNCTION PANEL
Albumin: 1.9 g/dL — ABNORMAL LOW (ref 3.5–5.0)
Anion gap: 18 — ABNORMAL HIGH (ref 5–15)
BUN: 116 mg/dL — AB (ref 6–20)
CALCIUM: 6.8 mg/dL — AB (ref 8.9–10.3)
CHLORIDE: 96 mmol/L — AB (ref 101–111)
CO2: 15 mmol/L — AB (ref 22–32)
CREATININE: 4.75 mg/dL — AB (ref 0.61–1.24)
GFR calc non Af Amer: 15 mL/min — ABNORMAL LOW (ref >60)
GFR, EST AFRICAN AMERICAN: 18 mL/min — AB (ref >60)
GLUCOSE: 64 mg/dL — AB (ref 65–99)
Phosphorus: 9.1 mg/dL — ABNORMAL HIGH (ref 2.5–4.6)
Potassium: 7 mmol/L (ref 3.5–5.1)
SODIUM: 129 mmol/L — AB (ref 135–145)

## 2017-12-14 LAB — COMPREHENSIVE METABOLIC PANEL
ALBUMIN: 2 g/dL — AB (ref 3.5–5.0)
ALT: 144 U/L — AB (ref 17–63)
ALT: 174 U/L — ABNORMAL HIGH (ref 17–63)
AST: 369 U/L — AB (ref 15–41)
AST: 438 U/L — ABNORMAL HIGH (ref 15–41)
Albumin: 2.1 g/dL — ABNORMAL LOW (ref 3.5–5.0)
Alkaline Phosphatase: 181 U/L — ABNORMAL HIGH (ref 38–126)
Alkaline Phosphatase: 170 U/L — ABNORMAL HIGH (ref 38–126)
Anion gap: >20 — ABNORMAL HIGH (ref 5–15)
BILIRUBIN TOTAL: 4.8 mg/dL — AB (ref 0.3–1.2)
BUN: 176 mg/dL — ABNORMAL HIGH (ref 6–20)
BUN: 173 mg/dL — ABNORMAL HIGH (ref 6–20)
CALCIUM: 6.8 mg/dL — AB (ref 8.9–10.3)
CHLORIDE: 90 mmol/L — AB (ref 101–111)
CO2: 9 mmol/L — ABNORMAL LOW (ref 22–32)
CO2: 11 mmol/L — ABNORMAL LOW (ref 22–32)
CREATININE: 7.82 mg/dL — AB (ref 0.61–1.24)
Calcium: 6.9 mg/dL — ABNORMAL LOW (ref 8.9–10.3)
Chloride: 91 mmol/L — ABNORMAL LOW (ref 101–111)
Creatinine, Ser: 7.03 mg/dL — ABNORMAL HIGH (ref 0.61–1.24)
GFR calc non Af Amer: 9 mL/min — ABNORMAL LOW (ref >60)
GFR, EST AFRICAN AMERICAN: 10 mL/min — AB (ref >60)
GFR, EST AFRICAN AMERICAN: 11 mL/min — AB (ref >60)
GFR, EST NON AFRICAN AMERICAN: 8 mL/min — AB (ref >60)
GLUCOSE: 106 mg/dL — AB (ref 65–99)
Glucose, Bld: 89 mg/dL (ref 65–99)
POTASSIUM: 6.1 mmol/L — AB (ref 3.5–5.1)
POTASSIUM: 6.1 mmol/L — AB (ref 3.5–5.1)
SODIUM: 126 mmol/L — AB (ref 135–145)
Sodium: 123 mmol/L — ABNORMAL LOW (ref 135–145)
TOTAL PROTEIN: 6.9 g/dL (ref 6.5–8.1)
Total Bilirubin: 4 mg/dL — ABNORMAL HIGH (ref 0.3–1.2)
Total Protein: 7 g/dL (ref 6.5–8.1)

## 2017-12-14 LAB — RAPID HIV SCREEN (HIV 1/2 AB+AG)
HIV 1/2 ANTIBODIES: NONREACTIVE
HIV-1 P24 ANTIGEN - HIV24: NONREACTIVE

## 2017-12-14 LAB — POTASSIUM: POTASSIUM: 5.7 mmol/L — AB (ref 3.5–5.1)

## 2017-12-14 LAB — BLOOD CULTURE ID PANEL (REFLEXED)
ACINETOBACTER BAUMANNII: NOT DETECTED
CANDIDA KRUSEI: NOT DETECTED
CANDIDA PARAPSILOSIS: NOT DETECTED
CARBAPENEM RESISTANCE: NOT DETECTED
Candida albicans: NOT DETECTED
Candida glabrata: NOT DETECTED
Candida tropicalis: NOT DETECTED
ENTEROBACTERIACEAE SPECIES: DETECTED — AB
Enterobacter cloacae complex: NOT DETECTED
Enterococcus species: NOT DETECTED
Escherichia coli: NOT DETECTED
HAEMOPHILUS INFLUENZAE: NOT DETECTED
Klebsiella oxytoca: NOT DETECTED
Klebsiella pneumoniae: NOT DETECTED
Listeria monocytogenes: NOT DETECTED
METHICILLIN RESISTANCE: NOT DETECTED
NEISSERIA MENINGITIDIS: NOT DETECTED
PSEUDOMONAS AERUGINOSA: NOT DETECTED
Proteus species: NOT DETECTED
SERRATIA MARCESCENS: DETECTED — AB
STAPHYLOCOCCUS AUREUS BCID: DETECTED — AB
STREPTOCOCCUS AGALACTIAE: NOT DETECTED
STREPTOCOCCUS PNEUMONIAE: NOT DETECTED
Staphylococcus species: DETECTED — AB
Streptococcus pyogenes: NOT DETECTED
Streptococcus species: NOT DETECTED

## 2017-12-14 LAB — POCT I-STAT, CHEM 8
BUN: 94 mg/dL — AB (ref 6–20)
CREATININE: 4.3 mg/dL — AB (ref 0.61–1.24)
Calcium, Ion: 0.96 mmol/L — ABNORMAL LOW (ref 1.15–1.40)
Chloride: 99 mmol/L — ABNORMAL LOW (ref 101–111)
GLUCOSE: 54 mg/dL — AB (ref 65–99)
HEMATOCRIT: 35 % — AB (ref 39.0–52.0)
HEMOGLOBIN: 11.9 g/dL — AB (ref 13.0–17.0)
Potassium: 5.7 mmol/L — ABNORMAL HIGH (ref 3.5–5.1)
Sodium: 133 mmol/L — ABNORMAL LOW (ref 135–145)
TCO2: 17 mmol/L — AB (ref 22–32)

## 2017-12-14 LAB — GLUCOSE, CAPILLARY
GLUCOSE-CAPILLARY: 60 mg/dL — AB (ref 65–99)
GLUCOSE-CAPILLARY: 104 mg/dL — AB (ref 65–99)
GLUCOSE-CAPILLARY: 103 mg/dL — AB (ref 65–99)
GLUCOSE-CAPILLARY: 100 mg/dL — AB (ref 65–99)
GLUCOSE-CAPILLARY: 59 mg/dL — AB (ref 65–99)
Glucose-Capillary: 107 mg/dL — ABNORMAL HIGH (ref 65–99)
Glucose-Capillary: 76 mg/dL (ref 65–99)

## 2017-12-14 LAB — DIFFERENTIAL
BASOS ABS: 0 10*3/uL (ref 0.0–0.1)
BASOS PCT: 0 %
EOS ABS: 0 10*3/uL (ref 0.0–0.7)
Eosinophils Relative: 0 %
LYMPHS PCT: 4 %
Lymphs Abs: 1.9 10*3/uL (ref 0.7–4.0)
MONO ABS: 0.9 10*3/uL (ref 0.1–1.0)
Monocytes Relative: 2 %
Neutro Abs: 44.2 10*3/uL — ABNORMAL HIGH (ref 1.7–7.7)
Neutrophils Relative %: 94 %

## 2017-12-14 LAB — BLOOD GAS, ARTERIAL
Acid-base deficit: 12.9 mmol/L — ABNORMAL HIGH (ref 0.0–2.0)
BICARBONATE: 11.4 mmol/L — AB (ref 20.0–28.0)
DRAWN BY: 232811
Drawn by: 422461
FIO2: 21
FIO2: 21
O2 SAT: 94.7 %
O2 SAT: 86.1 %
PATIENT TEMPERATURE: 96.2
PATIENT TEMPERATURE: 97
PO2 ART: 60.9 mmHg — AB (ref 83.0–108.0)
pCO2 arterial: 21.6 mmHg — ABNORMAL LOW (ref 32.0–48.0)
pH, Arterial: 7.222 — ABNORMAL LOW (ref 7.350–7.450)
pH, Arterial: 7.338 — ABNORMAL LOW (ref 7.350–7.450)
pO2, Arterial: 99.7 mmHg (ref 83.0–108.0)

## 2017-12-14 LAB — CBC
HCT: 34.3 % — ABNORMAL LOW (ref 39.0–52.0)
HCT: 32.2 % — ABNORMAL LOW (ref 39.0–52.0)
Hemoglobin: 12 g/dL — ABNORMAL LOW (ref 13.0–17.0)
Hemoglobin: 10.8 g/dL — ABNORMAL LOW (ref 13.0–17.0)
MCH: 28.1 pg (ref 26.0–34.0)
MCH: 28.6 pg (ref 26.0–34.0)
MCHC: 35 g/dL (ref 30.0–36.0)
MCHC: 33.5 g/dL (ref 30.0–36.0)
MCV: 81.9 fL (ref 78.0–100.0)
MCV: 83.9 fL (ref 78.0–100.0)
PLATELETS: 55 10*3/uL — AB (ref 150–400)
PLATELETS: 72 10*3/uL — AB (ref 150–400)
RBC: 3.84 MIL/uL — AB (ref 4.22–5.81)
RBC: 4.19 MIL/uL — AB (ref 4.22–5.81)
RDW: 17.7 % — AB (ref 11.5–15.5)
RDW: 18.5 % — AB (ref 11.5–15.5)
WBC: 42.6 10*3/uL — AB (ref 4.0–10.5)
WBC: 47 10*3/uL — AB (ref 4.0–10.5)

## 2017-12-14 LAB — POCT ACTIVATED CLOTTING TIME
ACTIVATED CLOTTING TIME: 164 s
ACTIVATED CLOTTING TIME: 158 s
ACTIVATED CLOTTING TIME: 175 s
ACTIVATED CLOTTING TIME: 175 s
ACTIVATED CLOTTING TIME: 175 s
ACTIVATED CLOTTING TIME: 180 s
Activated Clotting Time: 158 seconds
Activated Clotting Time: 131 seconds
Activated Clotting Time: 180 seconds
Activated Clotting Time: 175 seconds
Activated Clotting Time: 202 seconds
Activated Clotting Time: 191 seconds
Activated Clotting Time: 191 seconds

## 2017-12-14 LAB — CK: Total CK: 144 U/L (ref 49–397)

## 2017-12-14 LAB — LACTIC ACID, PLASMA
LACTIC ACID, VENOUS: 5.9 mmol/L — AB (ref 0.5–1.9)
Lactic Acid, Venous: 7.5 mmol/L (ref 0.5–1.9)

## 2017-12-14 LAB — PHOSPHORUS
PHOSPHORUS: 15.4 mg/dL — AB (ref 2.5–4.6)
Phosphorus: 13.3 mg/dL — ABNORMAL HIGH (ref 2.5–4.6)

## 2017-12-14 LAB — MAGNESIUM
MAGNESIUM: 3.1 mg/dL — AB (ref 1.7–2.4)
Magnesium: 2.9 mg/dL — ABNORMAL HIGH (ref 1.7–2.4)

## 2017-12-14 LAB — RPR: RPR: NONREACTIVE

## 2017-12-14 LAB — HIV ANTIBODY (ROUTINE TESTING W REFLEX)
HIV SCREEN 4TH GENERATION: NONREACTIVE
HIV Screen 4th Generation wRfx: NONREACTIVE

## 2017-12-14 LAB — PROCALCITONIN: PROCALCITONIN: 17.4 ng/mL

## 2017-12-14 LAB — MRSA PCR SCREENING: MRSA BY PCR: POSITIVE — AB

## 2017-12-14 LAB — CBG MONITORING, ED: Glucose-Capillary: 100 mg/dL — ABNORMAL HIGH (ref 65–99)

## 2017-12-14 LAB — APTT: APTT: 43 s — AB (ref 24–36)

## 2017-12-14 MED ORDER — PRISMASOL BGK 4/2.5 32-4-2.5 MEQ/L IV SOLN
INTRAVENOUS | Status: DC
  Administered 2017-12-14: 15:00:00 via INTRAVENOUS_CENTRAL
  Filled 2017-12-14 (×3): qty 5000

## 2017-12-14 MED ORDER — VANCOMYCIN HCL IN DEXTROSE 1-5 GM/200ML-% IV SOLN: 1000.0000 mg | INTRAVENOUS | Status: DC

## 2017-12-14 MED ORDER — MUPIROCIN 2 % EX OINT
1.0000 "application " | TOPICAL_OINTMENT | Freq: Two times a day (BID) | CUTANEOUS | Status: DC
  Administered 2017-12-14: 1 via NASAL
  Filled 2017-12-14: qty 22

## 2017-12-14 MED ORDER — SODIUM BICARBONATE 8.4 % IV SOLN
INTRAVENOUS | Status: DC
  Administered 2017-12-14: 04:00:00 via INTRAVENOUS
  Filled 2017-12-14: qty 150

## 2017-12-14 MED ORDER — LIDOCAINE HCL 1 % IJ SOLN
INTRAMUSCULAR | Status: AC
  Administered 2017-12-14: 05:00:00
  Filled 2017-12-14: qty 20

## 2017-12-14 MED ORDER — DEXMEDETOMIDINE HCL IN NACL 200 MCG/50ML IV SOLN
0.4000 ug/kg/h | INTRAVENOUS | Status: DC
  Administered 2017-12-14: 0.4 ug/kg/h via INTRAVENOUS
  Administered 2017-12-14: 1 ug/kg/h via INTRAVENOUS
  Administered 2017-12-14: 0.6 ug/kg/h via INTRAVENOUS
  Administered 2017-12-14 (×2): 0.8 ug/kg/h via INTRAVENOUS
  Filled 2017-12-14 (×4): qty 50

## 2017-12-14 MED ORDER — SODIUM BICARBONATE 8.4 % IV SOLN
INTRAVENOUS | Status: AC
  Filled 2017-12-14: qty 50

## 2017-12-14 MED ORDER — PRISMASOL BGK 4/2.5 32-4-2.5 MEQ/L IV SOLN
INTRAVENOUS | Status: DC
  Administered 2017-12-14 (×2): via INTRAVENOUS_CENTRAL
  Filled 2017-12-14 (×7): qty 5000

## 2017-12-14 MED ORDER — DEXTROSE 50 % IV SOLN
INTRAVENOUS | Status: AC
  Administered 2017-12-14: 25 mL via INTRAVENOUS
  Filled 2017-12-14: qty 50

## 2017-12-14 MED ORDER — GLYCOPYRROLATE 0.2 MG/ML IJ SOLN
0.1000 mg | Freq: Once | INTRAMUSCULAR | Status: AC
  Administered 2017-12-14: 0.1 mg via INTRAVENOUS
  Filled 2017-12-14: qty 1

## 2017-12-14 MED ORDER — HALOPERIDOL LACTATE 5 MG/ML IJ SOLN
INTRAMUSCULAR | Status: AC
  Administered 2017-12-14: 5 mg via INTRAMUSCULAR
  Filled 2017-12-14: qty 1

## 2017-12-14 MED ORDER — PRISMASOL BGK 4/2.5 32-4-2.5 MEQ/L IV SOLN
INTRAVENOUS | Status: DC
  Administered 2017-12-14 (×2): via INTRAVENOUS_CENTRAL
  Filled 2017-12-14 (×4): qty 5000

## 2017-12-14 MED ORDER — PRISMASOL BGK 4/2.5 32-4-2.5 MEQ/L IV SOLN
INTRAVENOUS | Status: DC
  Filled 2017-12-14: qty 5000

## 2017-12-14 MED ORDER — NOREPINEPHRINE BITARTRATE 1 MG/ML IV SOLN
0.0000 ug/min | INTRAVENOUS | Status: DC
  Filled 2017-12-14: qty 4

## 2017-12-14 MED ORDER — ORAL CARE MOUTH RINSE
15.0000 mL | Freq: Two times a day (BID) | OROMUCOSAL | Status: DC
  Administered 2017-12-14 (×2): 15 mL via OROMUCOSAL

## 2017-12-14 MED ORDER — FENTANYL BOLUS VIA INFUSION
50.0000 ug | INTRAVENOUS | Status: DC | PRN
  Filled 2017-12-14: qty 200

## 2017-12-14 MED ORDER — NOREPINEPHRINE 4 MG/250ML-% IV SOLN
0.0000 ug/min | INTRAVENOUS | Status: DC
  Filled 2017-12-14: qty 250

## 2017-12-14 MED ORDER — PRISMASOL BGK 0/2.5 32-2.5 MEQ/L IV SOLN
INTRAVENOUS | Status: DC
  Administered 2017-12-14: 18:00:00 via INTRAVENOUS_CENTRAL
  Filled 2017-12-14 (×3): qty 5000

## 2017-12-14 MED ORDER — NOREPINEPHRINE 16 MG/250ML-% IV SOLN
0.0000 ug/min | INTRAVENOUS | Status: DC
  Filled 2017-12-14: qty 250

## 2017-12-14 MED ORDER — SODIUM BICARBONATE 8.4 % IV SOLN
50.0000 meq | Freq: Once | INTRAVENOUS | Status: AC
  Administered 2017-12-14: 50 meq via INTRAVENOUS

## 2017-12-14 MED ORDER — NOREPINEPHRINE BITARTRATE 1 MG/ML IV SOLN: 0.0000 ug/min | INTRAVENOUS | Status: DC

## 2017-12-14 MED ORDER — PIPERACILLIN-TAZOBACTAM 3.375 G IVPB 30 MIN
3.3750 g | Freq: Four times a day (QID) | INTRAVENOUS | Status: DC
  Administered 2017-12-14: 3.375 g via INTRAVENOUS
  Filled 2017-12-14 (×2): qty 50

## 2017-12-14 MED ORDER — DEXTROSE 50 % IV SOLN
25.0000 mL | Freq: Once | INTRAVENOUS | Status: AC
  Administered 2017-12-14: 25 mL via INTRAVENOUS

## 2017-12-14 MED ORDER — DEXTROSE 5 % IV SOLN
2.0000 g | INTRAVENOUS | Status: DC
  Administered 2017-12-14: 2 g via INTRAVENOUS
  Filled 2017-12-14: qty 2

## 2017-12-14 MED ORDER — DEXTROSE 5 % IV SOLN
0.0000 ug/min | INTRAVENOUS | Status: DC
  Filled 2017-12-14: qty 16

## 2017-12-14 MED ORDER — CHLORHEXIDINE GLUCONATE CLOTH 2 % EX PADS
6.0000 | MEDICATED_PAD | Freq: Every day | CUTANEOUS | Status: DC
  Administered 2017-12-14: 6 via TOPICAL

## 2017-12-14 MED ORDER — NOREPINEPHRINE BITARTRATE 1 MG/ML IV SOLN
0.0000 ug/min | INTRAVENOUS | Status: DC
  Administered 2017-12-14: 10 ug/min via INTRAVENOUS
  Filled 2017-12-14: qty 16

## 2017-12-14 MED ORDER — PRISMASOL BGK 4/2.5 32-4-2.5 MEQ/L IV SOLN
INTRAVENOUS | Status: DC
  Administered 2017-12-14 (×3): via INTRAVENOUS_CENTRAL
  Filled 2017-12-14 (×6): qty 5000

## 2017-12-14 MED ORDER — HALOPERIDOL LACTATE 5 MG/ML IJ SOLN
5.0000 mg | Freq: Once | INTRAMUSCULAR | Status: AC
  Administered 2017-12-14: 5 mg via INTRAMUSCULAR

## 2017-12-14 MED ORDER — DEXTROSE 10 % IV SOLN
INTRAVENOUS | Status: DC
  Administered 2017-12-14: 20 mL/h via INTRAVENOUS

## 2017-12-14 MED ORDER — SODIUM CHLORIDE 0.9 % IV SOLN
100.0000 ug/h | INTRAVENOUS | Status: DC
  Administered 2017-12-14: 100 ug/h via INTRAVENOUS
  Filled 2017-12-14 (×2): qty 50

## 2017-12-14 MED ORDER — SODIUM CHLORIDE 0.9 % IV SOLN
2.0000 g | Freq: Once | INTRAVENOUS | Status: AC
  Administered 2017-12-14: 2 g via INTRAVENOUS
  Filled 2017-12-14: qty 20

## 2017-12-14 MED ORDER — LORAZEPAM 2 MG/ML IJ SOLN
2.0000 mg | Freq: Once | INTRAMUSCULAR | Status: AC
  Administered 2017-12-14: 2 mg via INTRAVENOUS

## 2017-12-14 MED ORDER — DEXTROSE 50 % IV SOLN
INTRAVENOUS | Status: AC
  Filled 2017-12-14: qty 50

## 2017-12-15 DIAGNOSIS — R740 Nonspecific elevation of levels of transaminase and lactic acid dehydrogenase [LDH]: Secondary | ICD-10-CM

## 2017-12-15 DIAGNOSIS — R7881 Bacteremia: Secondary | ICD-10-CM

## 2017-12-15 DIAGNOSIS — F1910 Other psychoactive substance abuse, uncomplicated: Secondary | ICD-10-CM

## 2017-12-15 DIAGNOSIS — B958 Unspecified staphylococcus as the cause of diseases classified elsewhere: Secondary | ICD-10-CM

## 2017-12-15 DIAGNOSIS — J9600 Acute respiratory failure, unspecified whether with hypoxia or hypercapnia: Secondary | ICD-10-CM

## 2017-12-15 DIAGNOSIS — R7401 Elevation of levels of liver transaminase levels: Secondary | ICD-10-CM

## 2017-12-15 DIAGNOSIS — T68XXXA Hypothermia, initial encounter: Secondary | ICD-10-CM

## 2017-12-15 DIAGNOSIS — E875 Hyperkalemia: Secondary | ICD-10-CM

## 2017-12-15 DIAGNOSIS — I330 Acute and subacute infective endocarditis: Secondary | ICD-10-CM

## 2017-12-15 LAB — ECHOCARDIOGRAM COMPLETE
HEIGHTINCHES: 73 in
Weight: 2888.91 oz

## 2017-12-15 LAB — POCT ACTIVATED CLOTTING TIME: Activated Clotting Time: 186 seconds

## 2017-12-15 LAB — PARATHYROID HORMONE, INTACT (NO CA): PTH: 142 pg/mL — ABNORMAL HIGH (ref 15–65)

## 2017-12-15 LAB — GLUCOSE, CAPILLARY: GLUCOSE-CAPILLARY: 65 mg/dL (ref 65–99)

## 2017-12-15 LAB — HAPTOGLOBIN: Haptoglobin: 160 mg/dL (ref 34–200)

## 2017-12-15 LAB — C3 COMPLEMENT: C3 COMPLEMENT: 24 mg/dL — AB (ref 82–167)

## 2017-12-15 LAB — C4 COMPLEMENT: Complement C4, Body Fluid: 4 mg/dL — ABNORMAL LOW (ref 14–44)

## 2017-12-15 NOTE — Progress Notes (Addendum)
Pt noted to have no heart rate, respirations or blood pressure at 2301. CDS called, not a donor for anything. Erik [name] uncle, decision maker for the patient notified and present as well as other family at time of death. 150cc of fentanyl wasted in sink with Anson OregonJ. Woody, RN as witness. Clothing sent home with family and post mortem check-list completed. Family does not wish autopsy to be performed. RN will continue monitor.

## 2017-12-16 LAB — COMMENT2 - HEP PANEL

## 2017-12-16 LAB — CULTURE, BLOOD (ROUTINE X 2)
SPECIAL REQUESTS: ADEQUATE
SPECIAL REQUESTS: ADEQUATE

## 2017-12-16 LAB — HEPATITIS B SURFACE ANTIGEN: Hepatitis B Surface Ag: NEGATIVE

## 2017-12-16 LAB — HEPATITIS C ANTIBODY (REFLEX)

## 2017-12-16 LAB — HEPATITIS B SURFACE ANTIBODY,QUALITATIVE: HEP B S AB: REACTIVE

## 2017-12-19 ENCOUNTER — Telehealth: Payer: Self-pay

## 2017-12-19 NOTE — Telephone Encounter (Signed)
On 12/19/17 I received a d/c from [employer] ([facility]). The d/c is for burial. The patient is a patient of Doctor Ramaswamy. The d/c will be taken to Pulmonary Unit for signature.  On 12/24/17 I received the d/c back from Doctor Ramaswamy. I got the d/c ready and called the funeral home to let them know I mailed the d/c to vital records per the funeral home request.

## 2017-12-26 NOTE — Discharge Summary (Signed)
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 Range 07/13/2017 22:17 12/26/2017 17:13 12/17/2017 17:18 12/10/2017 17:18 18-Dec-2017 06:17  Organism ID, Bacteria Unknown   SERRATIA MARCESCENS STAPHYLOCOCCUS AU...     SUMMARY Erik [name] was 32 y.o. patient with    has a past medical history of Anxiety, Arthritis, CHF (congestive heart failure) (HCC), Depression, GERD (gastroesophageal reflux disease), Hypertension, Migraine, Migraine, Neck pain, and Renal disorder.   has a past surgical history that includes Appendectomy and Wisdom tooth extraction.   Admitted on 12/11/2017 with   32 year old man with a history of drug abuse, MSSA tricuspid valve endocarditis, associated renal failure.  He left the hospital AGAINST MEDICAL ADVICE, has [facility].  He continues to use drugs, admitted that his last use was approximately 2 days ago.  He presented to the emergency department today with altered mental status, lethargy, "feeling terrible".  Found to have relative hypotension, acute chronic renal failure.  His lactic acid was elevated consistent with occult septic shock.   EVENTS 18-Dec-2017 - muttering, tachypenic. Mild confused. On CRRT. Not intubated. Over the course of day worsening shock and delirum and renal failure. Family conversations held repeatedly an due to grave prognosis palliative approach with terminal care adopted and patient died  12/15/2017      SIGNED Dr. Kalman ShanMurali Angeliz Settlemyre, M.D., [facility].C.C.P Pulmonary and Critical Care Medicine Staff Physician [facility] Pager: [phone], If no answer or between  15:00h - 7:00h: call [phone]  12/26/2017 1:22 PM

## 2017-12-27 NOTE — Progress Notes (Signed)
Chaplain responded to the pager request from the ICU Unit to provide emotional, and spiritual care presence for the family who is at the bedside of the patient. Chaplain presented to the patient's room where his grandparents, and brother were present, introduced self and shared my presence was one of a supportive care for the patient and family. Chaplain offered a word of prayer for the peace and comfort of the patient, I also asked the grandmother if they had a faith community  supporting them during this time, she shared they do, and she knows how much prayer has helped the patient is prior illness. Chaplain will follow up as needed. [phone]

## 2017-12-27 NOTE — Progress Notes (Signed)
PHARMACY - PHYSICIAN COMMUNICATION CRITICAL VALUE ALERT - BLOOD CULTURE IDENTIFICATION (BCID)  Erik [name] is an 32 y.o. male who [facility] with a chief complaint of AMS, lethargy, IVDU, renal failure, septic shock.  [facility] in July [facility] for MSSA  tricuspid valve endocarditis, but left AMA.  He was admitted again Sept 2018 with E.faecalis endocarditis.  Assessment:  Endocarditis   Name of physician (or Provider) Contacted: Ramaswamy  Current antibiotics: Vancomycin, Zosyn  Changes to prescribed antibiotics recommended:  Recommendations accepted by provider  MSSA - recommended treatment is Cefazolin 2g IV q8h Serratia - recommended treatment is Ceftriaxone 2g IV q24h Change from Vancomycin and Zosyn to Ceftriaxone alone.  Results for orders placed or performed during the hospital encounter of 11/27/2017  Blood Culture ID Panel (Reflexed) (Collected: 12/19/2017  5:18 PM)  Result Value Ref Range   Enterococcus species NOT DETECTED NOT DETECTED   Listeria monocytogenes NOT DETECTED NOT DETECTED   Staphylococcus species DETECTED (A) NOT DETECTED   Staphylococcus aureus DETECTED (A) NOT DETECTED   Methicillin resistance NOT DETECTED NOT DETECTED   Streptococcus species NOT DETECTED NOT DETECTED   Streptococcus agalactiae NOT DETECTED NOT DETECTED   Streptococcus pneumoniae NOT DETECTED NOT DETECTED   Streptococcus pyogenes NOT DETECTED NOT DETECTED   Acinetobacter baumannii NOT DETECTED NOT DETECTED   Enterobacteriaceae species DETECTED (A) NOT DETECTED   Enterobacter cloacae complex NOT DETECTED NOT DETECTED   Escherichia coli NOT DETECTED NOT DETECTED   Klebsiella oxytoca NOT DETECTED NOT DETECTED   Klebsiella pneumoniae NOT DETECTED NOT DETECTED   Proteus species NOT DETECTED NOT DETECTED   Serratia marcescens DETECTED (A) NOT DETECTED   Carbapenem resistance NOT DETECTED NOT DETECTED   Haemophilus influenzae NOT DETECTED  NOT DETECTED   Neisseria meningitidis NOT DETECTED NOT DETECTED   Pseudomonas aeruginosa NOT DETECTED NOT DETECTED   Candida albicans NOT DETECTED NOT DETECTED   Candida glabrata NOT DETECTED NOT DETECTED   Candida krusei NOT DETECTED NOT DETECTED   Candida parapsilosis NOT DETECTED NOT DETECTED   Candida tropicalis NOT DETECTED NOT DETECTED   Lynann Beaverhristine Thomasa Heidler PharmD, BCPS Pager [phone] 2018-06-29 10:21 AM

## 2017-12-27 NOTE — Progress Notes (Signed)
Patient placed on MRSA contact. No family at bedside to educate.

## 2017-12-27 NOTE — Consult Note (Signed)
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 He was admitted on 1/18 for 6 d history of feeling poorly, fever, chills, ILI. On Admit, found to have numerous lab abn in addition to hypotension concerning for severe sepsis. Wbc of 47.4K with 93% N, Na 122, Cr 8.4(up from baseline of 3.6), BUN 183, LA 5.9. AST 356, ALT 139. His physical exam was remarkable for tachycardia, systolic murmur as well as petechaie and janeway lesions to lower extremities. He was admitted to ICU for management. He has been seen by nephrology who will be doing CRRT. His blood cx + for MSSA and serratia. He was ruled out for flu. CXR shows numerous cavitary lesions c/w his hx of septic emboli to lungs per my read.  Past Medical History:  Diagnosis Date  . Anxiety   . Arthritis   . CHF (congestive heart failure) ([place])   . Depression   . GERD (gastroesophageal reflux disease)   . Hypertension   . Migraine   . Migraine   . Neck pain   . Renal disorder     Allergies:  Allergies  Allergen Reactions  . Codeine Nausea And Vomiting  . Levetiracetam Other (See Comments)    "said he became severely ill and angry mood swings" MD stopped medication  . Neurontin [Gabapentin] Other (See Comments)    "Feels out of it"  . Red Dye     Red dye #3  . Vicodin [Hydrocodone-Acetaminophen] Itching and Rash   MEDICATIONS: . Chlorhexidine Gluconate Cloth  6 each Topical Q0600  . fentaNYL (SUBLIMAZE) injection  50 mcg Intravenous Once  . heparin  5,000 Units Subcutaneous Q8H  . insulin aspart   0-9 Units Subcutaneous Q4H  . LORazepam  1 mg Intravenous Once  . mouth rinse  15 mL Mouth Rinse BID  . mupirocin ointment  1 application Nasal BID  . pantoprazole (PROTONIX) IV  40 mg Intravenous QHS    Social History   Tobacco Use  . Smoking status: Current Every Day Smoker    Packs/day: 1.00  . Smokeless tobacco: Never Used  Substance Use Topics  . Alcohol use: Yes    Comment: rarely  . Drug use: Yes    Comment: opiate misuse    History reviewed. No pertinent family history.  Review of Systems -  Unable to obtain since sedated  OBJECTIVE: Temp:  [93 F (33.9 C)-96.2 F (35.7 C)] 95 F (35 C) (01/19 0800) Pulse Rate:  [33-107] 68 (01/19 0800) Resp:  [20-34] 25 (01/19 0800) BP: (77-128)/(42-75) 128/42 (01/19 0800) SpO2:  [54 %-100 %] 100 % (01/19 0800) Weight:  [180 lb 8.9 oz (81.9 kg)-200 lb (90.7 kg)] 180 lb 8.9 oz (81.9 kg) (01/19 0345) Physical Exam  Constitutional: opens eyes to verbal stimuli. He appears age appropriate. In mild distress. Under heated blanket/bear hugger HENT:  No scleral icterus, Mouth/Throat: Oropharynx is clear and moist. No oropharyngeal exudate.  Cardiovascular: tachycardic difficult to auscultate murmur due to pulmonary rhonchi Pulmonary/Chest: tachypnea, bilateral rhonchi heard throughout Neck: right HD catheter in place Abdominal: Soft. Bowel sounds are normal. He exhibits no distension. There is no tenderness.   Neurological: spontaneous moves extremities Skin: petechaie bilateral lower extremities a few lesions on the right dorsum of foot evolving with some drainage    LABS: Results for orders placed or performed during the hospital encounter of 12/05/2017 (from the past 48 hour(s))  Comprehensive metabolic panel     Status: Abnormal   Collection Time: 12/10/2017  5:13 PM  Result Value Ref Range   Sodium 122 (L) 135 - 145 mmol/L    Comment: REPEATED TO VERIFY   Potassium 5.7 (H) 3.5 - 5.1 mmol/L    Comment: REPEATED TO VERIFY    Chloride 85 (L) 101 - 111 mmol/L    Comment: REPEATED TO VERIFY   CO2 12 (L) 22 - 32 mmol/L    Comment: REPEATED TO VERIFY   Glucose, Bld 89 65 - 99 mg/dL   BUN 183 (H) 6 - 20 mg/dL    Comment: RESULTS CONFIRMED BY MANUAL DILUTION   Creatinine, Ser 8.01 (H) 0.61 - 1.24 mg/dL   Calcium 7.5 (L) 8.9 - 10.3 mg/dL    Comment: REPEATED TO VERIFY   Total Protein 7.4 6.5 - 8.1 g/dL   Albumin 2.1 (L) 3.5 - 5.0 g/dL   AST 356 (H) 15 - 41 U/L    Comment: RESULTS CONFIRMED BY MANUAL DILUTION   ALT 139 (H) 17 - 63 U/L   Alkaline Phosphatase 170 (H) 38 - 126 U/L   Total Bilirubin 3.8 (H) 0.3 - 1.2 mg/dL   GFR calc non Af Amer 8 (L) >60 mL/min   GFR calc Af Amer 9 (L) >60 mL/min    Comment: (NOTE) The eGFR has been calculated using the CKD EPI equation. This calculation has not been validated in all clinical situations. eGFR's persistently <60 mL/min signify possible Chronic Kidney Disease.    Anion gap 25 (H) 5 - 15  CBC WITH DIFFERENTIAL     Status: Abnormal   Collection Time: 12/02/2017  5:13 PM  Result Value Ref Range   WBC 47.5 (H) 4.0 - 10.5 K/uL   RBC 4.33 4.22 - 5.81 MIL/uL   Hemoglobin 12.2 (L) 13.0 - 17.0 g/dL   HCT 35.4 (L) 39.0 - 52.0 %   MCV 81.8 78.0 - 100.0 fL   MCH 28.2 26.0 - 34.0 pg   MCHC 34.5 30.0 - 36.0 g/dL   RDW 17.6 (H) 11.5 - 15.5 %   Platelets 71 (L) 150 - 400 K/uL    Comment: SPECIMEN CHECKED FOR CLOTS REPEATED TO VERIFY PLATELET COUNT CONFIRMED BY SMEAR    Neutrophils Relative % 93 %   Lymphocytes Relative 5 %   Monocytes Relative 2 %   Eosinophils Relative 0 %   Basophils Relative 0 %   Neutro Abs 44.1 (H) 1.7 - 7.7 K/uL   Lymphs Abs 2.4 0.7 - 4.0 K/uL   Monocytes Absolute 1.0 0.1 - 1.0 K/uL   Eosinophils Absolute 0.0 0.0 - 0.7 K/uL   Basophils Absolute 0.0 0.0 - 0.1 K/uL   RBC Morphology POLYCHROMASIA PRESENT     Comment: RARE NRBCs   WBC Morphology MILD LEFT SHIFT (1-5% METAS, OCC MYELO, OCC BANDS)     Comment: TOXIC GRANULATION VACUOLATED  NEUTROPHILS   Blood Culture (routine x 2)     Status: None (Preliminary result)   Collection  Time: 12/11/2017  5:13 PM  Result Value Ref Range   Specimen Description BLOOD RIGHT ARM    Special Requests      BOTTLES DRAWN AEROBIC AND ANAEROBIC Blood Culture adequate volume   Culture  Setup Time      GRAM POSITIVE COCCI IN CLUSTERS GRAM NEGATIVE RODS IN BOTH AEROBIC AND ANAEROBIC BOTTLES CRITICAL VALUE NOTED.  VALUE IS CONSISTENT WITH PREVIOUSLY REPORTED AND CALLED VALUE. Performed at [facility], [place] [address] [place], [place] [address]    Culture GRAM POSITIVE COCCI GRAM NEGATIVE RODS     Report Status PENDING   Protime-INR     Status: Abnormal   Collection Time: 12/20/2017  5:13 PM  Result Value Ref Range   Prothrombin Time 27.9 (H) 11.4 - 15.2 seconds   INR 2.63   Blood Culture (routine x 2)     Status: None (Preliminary result)   Collection Time: 12/25/2017  5:18 PM  Result Value Ref Range   Specimen Description BLOOD RIGHT ARM LATERAL    Special Requests      BOTTLES DRAWN AEROBIC AND ANAEROBIC Blood Culture adequate volume   Culture  Setup Time      GRAM POSITIVE COCCI IN CLUSTERS GRAM NEGATIVE RODS IN BOTH AEROBIC AND ANAEROBIC BOTTLES CRITICAL RESULT CALLED TO, READ BACK BY AND VERIFIED WITH: T PICKERING,PHARMD AT 1005 07-Jan-2018 BY L BENFIELD Performed at [facility], [place] [address], [place], [place] [address]    Culture GRAM POSITIVE COCCI GRAM NEGATIVE RODS     Report Status PENDING   Blood Culture ID Panel (Reflexed)     Status: Abnormal   Collection Time: 12/20/2017  5:18 PM  Result Value Ref Range   Enterococcus species NOT DETECTED NOT DETECTED   Listeria monocytogenes NOT DETECTED NOT DETECTED   Staphylococcus species DETECTED (A) NOT DETECTED    Comment: CRITICAL RESULT CALLED TO, READ BACK BY AND VERIFIED WITH: T PICKERING,PHARMD AT 1005 01-07-18 BY L BENFIELD    Staphylococcus aureus DETECTED (A) NOT DETECTED    Comment: Methicillin (oxacillin)  susceptible Staphylococcus aureus (MSSA). Preferred therapy is anti staphylococcal beta lactam antibiotic (Cefazolin or Nafcillin), unless clinically contraindicated. CRITICAL RESULT CALLED TO, READ BACK BY AND VERIFIED WITH: T PICKERING,PHARMD AT 1005 01/07/18 BY L BENFIELD    Methicillin resistance NOT DETECTED NOT DETECTED   Streptococcus species NOT DETECTED NOT DETECTED   Streptococcus agalactiae NOT DETECTED NOT DETECTED   Streptococcus pneumoniae NOT DETECTED NOT DETECTED   Streptococcus pyogenes NOT DETECTED NOT DETECTED   Acinetobacter baumannii NOT DETECTED NOT DETECTED   Enterobacteriaceae species DETECTED (A) NOT DETECTED    Comment: Enterobacteriaceae represent a large family of gram-negative bacteria, not a single organism. CRITICAL RESULT CALLED TO, READ BACK BY AND VERIFIED WITH: T PICKERING,PHARMD AT 1005 07-Jan-2018 BY L BENFIELD    Enterobacter cloacae complex NOT DETECTED NOT DETECTED   Escherichia coli NOT DETECTED NOT DETECTED   Klebsiella oxytoca NOT DETECTED NOT DETECTED   Klebsiella pneumoniae NOT DETECTED NOT DETECTED   Proteus species NOT DETECTED NOT DETECTED   Serratia marcescens DETECTED (A) NOT DETECTED    Comment: CRITICAL RESULT CALLED TO, READ BACK BY AND VERIFIED WITH: T PICKERING,PHARMD AT 1005 01-07-2018 BY L BENFIELD    Carbapenem resistance NOT DETECTED NOT DETECTED   Haemophilus influenzae NOT DETECTED NOT DETECTED   Neisseria meningitidis NOT DETECTED NOT DETECTED   Pseudomonas aeruginosa NOT DETECTED NOT DETECTED   Candida albicans NOT DETECTED NOT DETECTED   Candida glabrata  NOT DETECTED NOT DETECTED   Candida krusei NOT DETECTED NOT DETECTED   Candida parapsilosis NOT DETECTED NOT DETECTED   Candida tropicalis NOT DETECTED NOT DETECTED    Comment: Performed at [facility], [address] [place], [place] [address]  Type and screen [place]     Status: None   Collection Time: 12/18/2017  5:24 PM  Result Value Ref Range    ABO/RH(D) O POS    Antibody Screen NEG    Sample Expiration 12/16/2017   Brain natriuretic peptide     Status: Abnormal   Collection Time: 11/27/2017  5:28 PM  Result Value Ref Range   B Natriuretic Peptide 1,037.9 (H) 0.0 - 100.0 pg/mL  Acetaminophen level     Status: Abnormal   Collection Time: 12/08/2017  5:28 PM  Result Value Ref Range   Acetaminophen (Tylenol), Serum <10 (L) 10 - 30 ug/mL    Comment:        THERAPEUTIC CONCENTRATIONS VARY SIGNIFICANTLY. A RANGE OF 10-30 ug/mL MAY BE AN EFFECTIVE CONCENTRATION FOR MANY PATIENTS. HOWEVER, SOME ARE BEST TREATED AT CONCENTRATIONS OUTSIDE THIS RANGE. ACETAMINOPHEN CONCENTRATIONS >150 ug/mL AT 4 HOURS AFTER INGESTION AND >50 ug/mL AT 12 HOURS AFTER INGESTION ARE OFTEN ASSOCIATED WITH TOXIC REACTIONS.   Salicylate level     Status: None   Collection Time: 12/11/2017  5:28 PM  Result Value Ref Range   Salicylate Lvl <6.0 2.8 - 30.0 mg/dL  Ethanol     Status: None   Collection Time: 12/12/2017  5:28 PM  Result Value Ref Range   Alcohol, Ethyl (B) <10 <10 mg/dL    Comment:        LOWEST DETECTABLE LIMIT FOR SERUM ALCOHOL IS 10 mg/dL FOR MEDICAL PURPOSES ONLY   Influenza panel by PCR (type A & B)     Status: None   Collection Time: 12/16/2017  5:30 PM  Result Value Ref Range   Influenza A By PCR NEGATIVE NEGATIVE   Influenza B By PCR NEGATIVE NEGATIVE    Comment: (NOTE) The Xpert Xpress Flu assay is intended as an aid in the diagnosis of  influenza and should not be used as a sole basis for treatment.  This  assay is FDA approved for nasopharyngeal swab specimens only. Nasal  washings and aspirates are unacceptable for Xpert Xpress Flu testing.   CK     Status: None   Collection Time: 12/25/2017  5:38 PM  Result Value Ref Range   Total CK 90 49 - 397 U/L  Fibrinogen     Status: None   Collection Time: 12/02/2017  5:38 PM  Result Value Ref Range   Fibrinogen 312 210 - 475 mg/dL  I-stat troponin, ED     Status: None    Collection Time: 12/25/2017  5:48 PM  Result Value Ref Range   Troponin i, poc 0.05 0.00 - 0.08 ng/mL   Comment 3            Comment: Due to the release kinetics of cTnI, a negative result within the first hours of the onset of symptoms does not rule out myocardial infarction with certainty. If myocardial infarction is still suspected, repeat the test at appropriate intervals.   I-Stat CG4 Lactic Acid, ED  (not at  [facility])     Status: Abnormal   Collection Time: 12/24/2017  5:50 PM  Result Value Ref Range   Lactic Acid, Venous 5.32 (HH) 0.5 - 1.9 mmol/L   Comment NOTIFIED  PHYSICIAN   I-stat Chem 8, ED     Status: Abnormal   Collection Time: 12/11/2017  5:50 PM  Result Value Ref Range   Sodium 121 (L) 135 - 145 mmol/L   Potassium 5.6 (H) 3.5 - 5.1 mmol/L   Chloride 90 (L) 101 - 111 mmol/L   BUN >140 (H) 6 - 20 mg/dL   Creatinine, Ser 8.40 (H) 0.61 - 1.24 mg/dL   Glucose, Bld 94 65 - 99 mg/dL   Calcium, Ion 0.85 (LL) 1.15 - 1.40 mmol/L   TCO2 13 (L) 22 - 32 mmol/L   Hemoglobin 13.9 13.0 - 17.0 g/dL   HCT 41.0 39.0 - 52.0 %   Comment NOTIFIED PHYSICIAN   Lactate dehydrogenase     Status: Abnormal   Collection Time: 12/03/2017  6:16 PM  Result Value Ref Range   LDH 446 (H) 98 - 192 U/L  Uric acid     Status: Abnormal   Collection Time: 12/09/2017  6:16 PM  Result Value Ref Range   Uric Acid, Serum 20.7 (H) 4.4 - 7.6 mg/dL  CK     Status: None   Collection Time: 2017-12-17 12:34 AM  Result Value Ref Range   Total CK 144 49 - 397 U/L  CBC     Status: Abnormal   Collection Time: 12-17-17 12:34 AM  Result Value Ref Range   WBC 42.6 (H) 4.0 - 10.5 K/uL   RBC 4.19 (L) 4.22 - 5.81 MIL/uL   Hemoglobin 12.0 (L) 13.0 - 17.0 g/dL   HCT 34.3 (L) 39.0 - 52.0 %   MCV 81.9 78.0 - 100.0 fL   MCH 28.6 26.0 - 34.0 pg   MCHC 35.0 30.0 - 36.0 g/dL   RDW 17.7 (H) 11.5 - 15.5 %   Platelets 72 (L) 150 - 400 K/uL    Comment: REPEATED TO VERIFY SPECIMEN CHECKED FOR CLOTS PLATELET COUNT CONFIRMED BY  SMEAR   Comprehensive metabolic panel     Status: Abnormal   Collection Time: 17-Dec-2017 12:34 AM  Result Value Ref Range   Sodium 123 (L) 135 - 145 mmol/L   Potassium 6.1 (H) 3.5 - 5.1 mmol/L    Comment: SLIGHT HEMOLYSIS   Chloride 90 (L) 101 - 111 mmol/L   CO2 9 (L) 22 - 32 mmol/L   Glucose, Bld 89 65 - 99 mg/dL   BUN 176 (H) 6 - 20 mg/dL    Comment: RESULTS CONFIRMED BY MANUAL DILUTION   Creatinine, Ser 7.82 (H) 0.61 - 1.24 mg/dL   Calcium 6.9 (L) 8.9 - 10.3 mg/dL   Total Protein 7.0 6.5 - 8.1 g/dL   Albumin 2.1 (L) 3.5 - 5.0 g/dL   AST 369 (H) 15 - 41 U/L   ALT 144 (H) 17 - 63 U/L   Alkaline Phosphatase 181 (H) 38 - 126 U/L   Total Bilirubin 4.0 (H) 0.3 - 1.2 mg/dL   GFR calc non Af Amer 8 (L) >60 mL/min   GFR calc Af Amer 10 (L) >60 mL/min    Comment: (NOTE) The eGFR has been calculated using the CKD EPI equation. This calculation has not been validated in all clinical situations. eGFR's persistently <60 mL/min signify possible Chronic Kidney Disease.    Anion gap >20 (H) 5 - 15  Magnesium     Status: Abnormal   Collection Time: 12-17-2017 12:34 AM  Result Value Ref Range   Magnesium 3.1 (H) 1.7 - 2.4 mg/dL  Phosphorus     Status: Abnormal  Collection Time: 12-16-2017 12:34 AM  Result Value Ref Range   Phosphorus 15.4 (H) 2.5 - 4.6 mg/dL    Comment: RESULTS CONFIRMED BY MANUAL DILUTION ICTERUS AT THIS LEVEL MAY AFFECT RESULT   Lactic acid, plasma     Status: Abnormal   Collection Time: December 16, 2017 12:34 AM  Result Value Ref Range   Lactic Acid, Venous 5.9 (HH) 0.5 - 1.9 mmol/L    Comment: CRITICAL RESULT CALLED TO, READ BACK BY AND VERIFIED WITH: T DOSTER,RN 12/14/16 0118 RHOLMES   Procalcitonin     Status: None   Collection Time: 12-16-2017 12:34 AM  Result Value Ref Range   Procalcitonin 17.40 ng/mL    Comment:        Interpretation: PCT >= 10 ng/mL: Important systemic inflammatory response, almost exclusively due to severe bacterial sepsis or septic  shock. (NOTE)       Sepsis PCT Algorithm           Lower Respiratory Tract                                      Infection PCT Algorithm    ----------------------------     ----------------------------         PCT < 0.25 ng/mL                PCT < 0.10 ng/mL         Strongly encourage             Strongly discourage   discontinuation of antibiotics    initiation of antibiotics    ----------------------------     -----------------------------       PCT 0.25 - 0.50 ng/mL            PCT 0.10 - 0.25 ng/mL               OR       >80% decrease in PCT            Discourage initiation of                                            antibiotics      Encourage discontinuation           of antibiotics    ----------------------------     -----------------------------         PCT >= 0.50 ng/mL              PCT 0.26 - 0.50 ng/mL                AND       <80% decrease in PCT             Encourage initiation of                                             antibiotics       Encourage continuation           of antibiotics    ----------------------------     -----------------------------        PCT >= 0.50 ng/mL  PCT > 0.50 ng/mL               AND         increase in PCT                  Strongly encourage                                      initiation of antibiotics    Strongly encourage escalation           of antibiotics                                     -----------------------------                                           PCT <= 0.25 ng/mL                                                 OR                                        > 80% decrease in PCT                                     Discontinue / Do not initiate                                             antibiotics   Rapid HIV screen (HIV 1/2 Ab+Ag)     Status: None   Collection Time: 12-22-2017 12:34 AM  Result Value Ref Range   HIV-1 P24 Antigen - HIV24 NON REACTIVE NON REACTIVE   HIV 1/2 Antibodies NON REACTIVE NON  REACTIVE   Interpretation (HIV Ag Ab)      A non reactive test result means that HIV 1 or HIV 2 antibodies and HIV 1 p24 antigen were not detected in the specimen.  CBG monitoring, ED     Status: Abnormal   Collection Time: 2017-12-22  1:12 AM  Result Value Ref Range   Glucose-Capillary 100 (H) 65 - 99 mg/dL  Glucose, capillary     Status: Abnormal   Collection Time: 12-22-17  3:53 AM  Result Value Ref Range   Glucose-Capillary 60 (L) 65 - 99 mg/dL  Blood gas, arterial     Status: Abnormal   Collection Time: December 22, 2017  4:00 AM  Result Value Ref Range   FIO2 21.00    Delivery systems ROOM AIR    pH, Arterial 7.222 (L) 7.350 - 7.450   pCO2 arterial VALUE BELOW REPORTABLE RANGE 32.0 - 48.0 mmHg    Comment: CRITICAL RESULT CALLED TO, READ BACK BY AND VERIFIED WITH: HANS JOHNSON, RN AT 0410 BY ANNALISSA AGUSTIN,RRT,RCP ON 2017/12/22    pO2, Arterial 99.7  83.0 - 108.0 mmHg   O2 Saturation 94.7 %   Patient temperature 96.2    Collection site RIGHT RADIAL    Drawn by [id number]    Sample type ARTERIAL    Allens test (pass/fail) PASS PASS  Glucose, capillary     Status: Abnormal   Collection Time: Dec 19, 2017  4:11 AM  Result Value Ref Range   Glucose-Capillary 107 (H) 65 - 99 mg/dL  MRSA PCR Screening     Status: Abnormal   Collection Time: December 19, 2017  6:17 AM  Result Value Ref Range   MRSA by PCR POSITIVE (A) NEGATIVE    Comment:        The GeneXpert MRSA Assay (FDA approved for NASAL specimens only), is one component of a comprehensive MRSA colonization surveillance program. It is not intended to diagnose MRSA infection nor to guide or monitor treatment for MRSA infections. RESULT CALLED TO, READ BACK BY AND VERIFIED WITH: T.O'LERAY,RN 2017-12-19 @0918  BY V.WILKINS   POCT Activated clotting time     Status: None   Collection Time: 2017-12-19  6:31 AM  Result Value Ref Range   Activated Clotting Time 158 seconds  Glucose, capillary     Status: Abnormal   Collection Time: 2017/12/19  7:21 AM   Result Value Ref Range   Glucose-Capillary 104 (H) 65 - 99 mg/dL  POCT Activated clotting time     Status: None   Collection Time: December 19, 2017  7:23 AM  Result Value Ref Range   Activated Clotting Time 164 seconds  POCT Activated clotting time     Status: None   Collection Time: 12-19-17  8:21 AM  Result Value Ref Range   Activated Clotting Time 158 seconds  Comprehensive metabolic panel     Status: Abnormal (Preliminary result)   Collection Time: 12-19-2017  8:30 AM  Result Value Ref Range   Sodium 126 (L) 135 - 145 mmol/L   Potassium 6.1 (H) 3.5 - 5.1 mmol/L   Chloride 91 (L) 101 - 111 mmol/L   CO2 11 (L) 22 - 32 mmol/L   Glucose, Bld 106 (H) 65 - 99 mg/dL   BUN PENDING 6 - 20 mg/dL   Creatinine, Ser 7.03 (H) 0.61 - 1.24 mg/dL   Calcium 6.8 (L) 8.9 - 10.3 mg/dL   Total Protein 6.9 6.5 - 8.1 g/dL   Albumin 2.0 (L) 3.5 - 5.0 g/dL   AST 438 (H) 15 - 41 U/L   ALT 174 (H) 17 - 63 U/L   Alkaline Phosphatase PENDING 38 - 126 U/L   Total Bilirubin 4.8 (H) 0.3 - 1.2 mg/dL   GFR calc non Af Amer 9 (L) >60 mL/min   GFR calc Af Amer 11 (L) >60 mL/min    Comment: (NOTE) The eGFR has been calculated using the CKD EPI equation. This calculation has not been validated in all clinical situations. eGFR's persistently <60 mL/min signify possible Chronic Kidney Disease.    Anion gap >20 (H) 5 - 15  CBC     Status: Abnormal (Preliminary result)   Collection Time: 12/19/17  8:30 AM  Result Value Ref Range   WBC 47.0 (H) 4.0 - 10.5 K/uL   RBC 3.84 (L) 4.22 - 5.81 MIL/uL   Hemoglobin 10.8 (L) 13.0 - 17.0 g/dL   HCT 32.2 (L) 39.0 - 52.0 %   MCV 83.9 78.0 - 100.0 fL   MCH 28.1 26.0 - 34.0 pg   MCHC 33.5 30.0 - 36.0 g/dL   RDW 18.5 (H) 11.5 -  15.5 %   Platelets PENDING 150 - 400 K/uL  Differential     Status: None (Preliminary result)   Collection Time: 04-Jan-2018  8:30 AM  Result Value Ref Range   Neutrophils Relative % PENDING %   Neutro Abs PENDING 1.7 - 7.7 K/uL   Band Neutrophils PENDING  %   Lymphocytes Relative PENDING %   Lymphs Abs PENDING 0.7 - 4.0 K/uL   Monocytes Relative PENDING %   Monocytes Absolute PENDING 0.1 - 1.0 K/uL   Eosinophils Relative PENDING %   Eosinophils Absolute PENDING 0.0 - 0.7 K/uL   Basophils Relative PENDING %   Basophils Absolute PENDING 0.0 - 0.1 K/uL   WBC Morphology PENDING    RBC Morphology PENDING    Smear Review PENDING    nRBC PENDING 0 /100 WBC   Metamyelocytes Relative PENDING %   Myelocytes PENDING %   Promyelocytes Absolute PENDING %   Blasts PENDING %  Magnesium     Status: Abnormal   Collection Time: 01-04-18  8:30 AM  Result Value Ref Range   Magnesium 2.9 (H) 1.7 - 2.4 mg/dL  APTT     Status: Abnormal   Collection Time: 04-Jan-2018  8:30 AM  Result Value Ref Range   aPTT 43 (H) 24 - 36 seconds    Comment:        IF BASELINE aPTT IS ELEVATED, SUGGEST PATIENT RISK ASSESSMENT BE USED TO DETERMINE APPROPRIATE ANTICOAGULANT THERAPY.   POCT Activated clotting time     Status: None   Collection Time: 01/04/18  9:36 AM  Result Value Ref Range   Activated Clotting Time 175 seconds   Yeast on ua  MICRO: Blood cx Serratia and MSSA in 2 sets IMAGING: US Renal  Result Date: 12/01/2017 CLINICAL DATA:  Acute kidney injury. EXAM: RENAL / URINARY TRACT ULTRASOUND COMPLETE COMPARISON:  None. FINDINGS: Right Kidney: Length: 14.3. Increased echogenicity. Small volume of perinephric fluid identified. No mass or hydronephrosis visualized. Left Kidney: Length: 14 cm. Increased parenchymal echogenicity. No mass or hydronephrosis visualized. Bladder: Appears normal for degree of bladder distention. IMPRESSION: 1. Bilateral echogenic kidneys compatible with chronic medical renal disease. 2. Small volume of right perinephric fluid. Electronically Signed   By: Kerby Moors M.D.   On: 12/07/2017 21:46   Dg Chest Port 1 View  Result Date: January 04, 2018 CLINICAL DATA:  Shortness of Breath EXAM: PORTABLE CHEST 1 VIEW COMPARISON:  Study obtained  earlier in the day FINDINGS: Central catheter tip in the superior cava. No pneumothorax. Areas of patchy airspace opacity noted in both mid and lower lung zones. There is stable cardiomegaly. Pulmonary vascularity is normal. No adenopathy. No evident bone lesions. IMPRESSION: Central catheter tip in superior vena cava. No pneumothorax. Airspace opacity bilaterally, likely multifocal pneumonia. Stable cardiac prominence. Electronically Signed   By: Lowella Grip III M.D.   On: 01/04/2018 09:38   Dg Chest Port 1 View  Result Date: 04-Jan-2018 CLINICAL DATA:  32 year old male with central line placement. EXAM: PORTABLE CHEST 1 VIEW COMPARISON:  Chest radiograph dated 11/29/2017 FINDINGS: Right IJ central line with tip over central SVC.  No pneumothorax. Bilateral confluent airspace opacities with possible cystic or cavitary changes similar to prior radiograph. There is no pleural effusion. Stable cardiomegaly. No acute osseous pathology. IMPRESSION: 1. Interval placement of a right IJ central line with tip over central SVC. No pneumothorax. 2. Bilateral confluent airspace densities grossly similar to prior radiograph. 3. Cardiomegaly. Electronically Signed   By: Laren Everts.D.  On: Jan 03, 2018 05:53   Dg Chest Port 1 View  Result Date: 12/08/2017 CLINICAL DATA:  Cough with chest pain EXAM: PORTABLE CHEST 1 VIEW COMPARISON:  CT chest 08/08/2017, radiograph 08/15/2017 FINDINGS: Multiple bilateral cystic and cavitary lung lesions, increased compared to the prior radiograph. Mild cardiomegaly. No pleural effusion. No pneumothorax IMPRESSION: 1. Increased bilateral cystic and cavitary lesions compared to prior radiograph and CT 2. Cardiomegaly Electronically Signed   By: Donavan Foil M.D.   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 transaminitis likely from hypotesion/LA of 4. Physical exam c/w septic emboli from endocarditis with respiratory distress and acute kidney injury -remains critically ill  - continue with management of hypotension and renal replacement therapy due to aki/electrolyte imbalance  AMS = from underlying infection and BUN of > 100s  Endocarditis = agree with getting repeat TTE to see if other valves involved in addn to TV, which I suspect is also leading to his hemodynamic instability. We would need to discuss with CT surgery if he would be a candidate for surgery, but maybe too high risk given other co-morbidities and active drug use.  At some point would CT head to see if any CNS septic emboli  - continue on ceftriaxone to treat both serratia and mssa. Will follow for sensitivities  Acute on ckd, necessitating CRRT - will need to remember that his access was placed in setting of bacteremia  Hx of heroin use - once stabilized, will need to  consider bridging with suboxone vs. other opiates while getting treated if he improves his current status.  Chronic hep c = previous to his admit his alt/ast were WNL. Elevated likely from hypotension/severe sepsis. Will trend out his ast/alt

## 2017-12-27 NOTE — Progress Notes (Signed)
PULMONARY / CRITICAL CARE MEDICINE   Name: Erik MarketRichard Cargile MRN: [id number] DOB: 06-21-1986    ADMISSION DATE:  11-Apr-2018  REFERRING MD:  Lyndel SafeHammond, Elizabeth  CHIEF COMPLAINT:  Altered MS, lethargy, "I feel terrible"   BRIEF   [age] year old man with a history of drug abuse, MSSA tricuspid valve endocarditis, associated renal failure.  He left the hospital AGAINST MEDICAL ADVICE, has [facility].  He continues to use drugs, admitted that his last use was approximately 2 days ago.  He presented to the emergency department today with altered mental status, lethargy, "feeling terrible".  Found to have relative hypotension, acute chronic renal failure.  His lactic acid was elevated consistent with occult septic shock. STUDIES:   CULTURES: Blood 1/18 Urine 1/18  ANTIBIOTICS: Vancomycin 1/18 Zosyn 1/18  EVENTS 12/25/2017 - muttering, tachypenic. Mild confused. On CRRT. Not intubated. Not on pressors but getting hypotensive . cXR - ? Septic embolii  VITAL SIGNS: BP (!) 128/42 (BP Location: Left Arm)   Pulse 68   Temp (!) 95 F (35 C) (Axillary) Comment: bearhugger and CRRT warmer on  Resp (!) 25   Ht 6\' 1"  (1.854 m)   Wt 81.9 kg (180 lb 8.9 oz)   SpO2 100%   BMI 23.82 kg/m   HEMODYNAMICS:    VENTILATOR SETTINGS:    INTAKE / OUTPUT: I/O last 3 completed shifts: In: 3720 [I.V.:270; IV Piggyback:3450] Out: 0   PHYSICAL EXAMINATION:  General Appearance:    Looks criticall ill. Lean  Head:    Normocephalic, without obvious abnormality, atraumatic  Eyes:    PERRL - yes, conjunctiva/corneas - clear      Ears:    Normal external ear canals, both ears  Nose:   NG tube - no but has [place]  Throat:  ETT TUBE - no , OG tube - no  Neck:   Supple,  No enlargement/tenderness/nodules     Lungs:     Clear to auscultation bilaterally, But tachypenic and some paradoxical  Chest wall:    No deformity  Heart:    S1 and S2 normal, no murmur, CVP - no.   Pressors - not yet  Abdomen:     Soft, no masses, no organomegaly  Genitalia:    Not done  Rectal:   not done  Extremities:   Extremities- intact     Skin:   Intact in exposed areas .but has discreete papules     Neurologic:   Sedation - none -> RASS - +1 . Moves all 4s - yes. CAM-ICU - positive for delirum . Orientation - partial only       LABS:  PULMONARY Recent Labs  Lab 07/15/2018 1750 12/26/2017 0400  PHART  --  7.222*  PCO2ART  --  VALUE BELOW REPORTABLE RANGE  PO2ART  --  99.7  TCO2 13*  --   O2SAT  --  94.7    CBC Recent Labs  Lab 07/15/2018 1713 07/15/2018 1750 11/26/2017 0034 12/19/2017 0830  HGB 12.2* 13.9 12.0* 10.8*  HCT 35.4* 41.0 34.3* 32.2*  WBC 47.5*  --  42.6* 47.0*  PLT 71*  --  72* PENDING    COAGULATION Recent Labs  Lab 07/15/2018 1713  INR 2.63    CARDIAC  No results for input(s): TROPONINI in the last 168 hours. No results for input(s): PROBNP in the last 168 hours.   CHEMISTRY Recent Labs  Lab 07/15/2018 1713 07/15/2018 1750 11/30/2017 0034  NA 122* 121* 123*  K 5.7* 5.6* 6.1*  CL 85* 90* 90*  CO2 12*  --  9*  GLUCOSE 89 94 89  BUN 183* >140* 176*  CREATININE 8.01* 8.40* 7.82*  CALCIUM 7.5*  --  6.9*  MG  --   --  3.1*  PHOS  --   --  15.4*   Estimated Creatinine Clearance: 15.8 mL/min (A) (by C-G formula based on SCr of 7.82 mg/dL (H)).   LIVER Recent Labs  Lab December 22, 2017 1713 12/04/2017 0034  AST 356* 369*  ALT 139* 144*  ALKPHOS 170* 181*  BILITOT 3.8* 4.0*  PROT 7.4 7.0  ALBUMIN 2.1* 2.1*  INR 2.63  --      INFECTIOUS Recent Labs  Lab Dec 22, 2017 1750 12/10/2017 0034  LATICACIDVEN 5.32* 5.9*  PROCALCITON  --  17.40     ENDOCRINE CBG (last 3)  Recent Labs    12/13/2017 0353 12/04/2017 0411 12/07/2017 0721  GLUCAP 60* 107* 104*         IMAGING x48h  - image(s) personally visualized  -   highlighted in bold US Renal  Result Date: 12/22/2017 CLINICAL DATA:  Acute kidney injury. EXAM: RENAL / URINARY TRACT  ULTRASOUND COMPLETE COMPARISON:  None. FINDINGS: Right Kidney: Length: 14.3. Increased echogenicity. Small volume of perinephric fluid identified. No mass or hydronephrosis visualized. Left Kidney: Length: 14 cm. Increased parenchymal echogenicity. No mass or hydronephrosis visualized. Bladder: Appears normal for degree of bladder distention. IMPRESSION: 1. Bilateral echogenic kidneys compatible with chronic medical renal disease. 2. Small volume of right perinephric fluid. Electronically Signed   By: Signa Kell M.D.   On: 12-22-2017 21:46   Dg Chest Port 1 View  Result Date: 11/26/2017 CLINICAL DATA:  32 year old male with central line placement. EXAM: PORTABLE CHEST 1 VIEW COMPARISON:  Chest radiograph dated 12-22-2017 FINDINGS: Right IJ central line with tip over central SVC.  No pneumothorax. Bilateral confluent airspace opacities with possible cystic or cavitary changes similar to prior radiograph. There is no pleural effusion. Stable cardiomegaly. No acute osseous pathology. IMPRESSION: 1. Interval placement of a right IJ central line with tip over central SVC. No pneumothorax. 2. Bilateral confluent airspace densities grossly similar to prior radiograph. 3. Cardiomegaly. Electronically Signed   By: Elgie Collard M.D.   On: 12/10/2017 05:53   Dg Chest Port 1 View  Result Date: December 22, 2017 CLINICAL DATA:  Cough with chest pain EXAM: PORTABLE CHEST 1 VIEW COMPARISON:  CT chest 08/08/2017, radiograph 08/15/2017 FINDINGS: Multiple bilateral cystic and cavitary lung lesions, increased compared to the prior radiograph. Mild cardiomegaly. No pleural effusion. No pneumothorax IMPRESSION: 1. Increased bilateral cystic and cavitary lesions compared to prior radiograph and CT 2. Cardiomegaly Electronically Signed   By: Jasmine Pang M.D.   On: 2017/12/22 17:48     Results for orders placed or performed during the hospital encounter of 12-22-17  Blood Culture (routine x 2)     Status: None (Preliminary  result)   Collection Time: 12-22-2017  5:13 PM  Result Value Ref Range Status   Specimen Description BLOOD RIGHT ARM  Final   Special Requests   Final    BOTTLES DRAWN AEROBIC AND ANAEROBIC Blood Culture adequate volume   Culture  Setup Time   Final    GRAM POSITIVE COCCI IN CLUSTERS GRAM NEGATIVE RODS IN BOTH AEROBIC AND ANAEROBIC BOTTLES Performed at [facility], [address] [place], [place] [address]    Culture GRAM POSITIVE COCCI GRAM NEGATIVE RODS   Final   Report  Status PENDING  Incomplete  Blood Culture (routine x 2)     Status: None (Preliminary result)   Collection Time: 12/24/2017  5:18 PM  Result Value Ref Range Status   Specimen Description BLOOD RIGHT ARM LATERAL  Final   Special Requests   Final    BOTTLES DRAWN AEROBIC AND ANAEROBIC Blood Culture adequate volume   Culture  Setup Time   Final    GRAM POSITIVE COCCI IN CLUSTERS GRAM NEGATIVE RODS IN BOTH AEROBIC AND ANAEROBIC BOTTLES Organism ID to follow Performed at [facility], [address] [place], [place] [address]    Culture GRAM POSITIVE COCCI GRAM NEGATIVE RODS   Final   Report Status PENDING  Incomplete  MRSA PCR Screening     Status: Abnormal   Collection Time: Jan 08, 2018  6:17 AM  Result Value Ref Range Status   MRSA by PCR POSITIVE (A) NEGATIVE Final    Comment:        The GeneXpert MRSA Assay (FDA approved for NASAL specimens only), is one component of a comprehensive MRSA colonization surveillance program. It is not intended to diagnose MRSA infection nor to guide or monitor treatment for MRSA infections. RESULT CALLED TO, READ BACK BY AND VERIFIED WITH: T.O'LERAY,RN 01/08/18 @0918  BY V.WILKINS       SIGNIFICANT EVENTS:  LINES/TUBES:  DISCUSSION: 32 year old man with drug abuse, tricuspid MSSA native valve endocarditis with septic shock and acute on chronic renal failure.  ASSESSMENT / PLAN:  PULMONARY A:  Mild acute resp failure with moderate resp distress  and  at high risk for itnubation  P:   Pulmonary hygiene Intubate if worse  CARDIOVASCULAR A:  Severe sepsis, at risk for shock Native valve tricuspid MSSA endocarditis, noncompliant has not been fully treated  - circulatory shock +  P:  Start levophed Follow lactate for clearance Repeat echocardiogram Unclear at what point he would be a candidate to discuss possible valvular replacement.  He would need to be clean from illicit substances  RENAL A:   Acute on chronic renal failure presumed ATN, consider embolic injury  6/60/6301 - on CRRT  P:   CRRT per renal  GASTROINTESTINAL A:   SUP P:   PPI NPO  HEMATOLOGIC A:   Petechiae on bilateral lower extremities P:  DIC panel pending, follow CBC  INFECTIOUS A:   Severe sepsis MSSA tricuspid valve endocarditis HIV negative  P:   Blood cultures drawn and pending Repeat echocardiogram Treat with broad-spectrum antibiotics at least initially until we confirmed that this continues to be MSSA   ENDOCRINE A:   At risk hyperglycemia P:   SSI   NEUROLOGIC A:   Acute encephalopathy +  P:   RASS goal: 0 Hold sedating medications Start precedex gtt   FAMILY  - Updates: None Present. RN has parernts numbers January 08, 2018 and patient consented for them to be called    The patient is critically ill with multiple organ systems failure and requires high complexity decision making for assessment and support, frequent evaluation and titration of therapies, application of advanced monitoring technologies and extensive interpretation of multiple databases.   Critical Care Time devoted to patient care services described in this note is  30  Minutes. This time reflects time of care of this signee Dr Kalman Shan. This critical care time does not reflect procedure time, or teaching time or supervisory time of PA/NP/Med student/Med Resident etc but could involve care discussion time    Dr. Kalman Shan, M.D.,  F.C.C.P Pulmonary and Critical Care Medicine Staff Physician [facility] [place] [facility] Pager: [phone], If no answer or between  15:00h - 7:00h: call [phone]  2017/12/21 9:33 AM

## 2017-12-27 NOTE — Progress Notes (Signed)
Subjective: Interval History: started CRRT <3  h ago after chem done.  Moaning not coherent  Objective: Vital signs in last 24 hours: Temp:  [93 F (33.9 C)-96.2 F (35.7 C)] 95 F (35 C) (01/19 0800) Pulse Rate:  [33-107] 68 (01/19 1100) Resp:  [20-34] 27 (01/19 1100) BP: (77-128)/(20-93) 96/20 (01/19 1100) SpO2:  [54 %-100 %] 90 % (01/19 1100) Weight:  [81.9 kg (180 lb 8.9 oz)-90.7 kg (200 lb)] 81.9 kg (180 lb 8.9 oz) (01/19 0345) Weight change:   Intake/Output from previous day: 01/18 0701 - 01/19 0700 In: 3720 [I.V.:270; IV Piggyback:3450] Out: 0  Intake/Output this shift: Total I/O In: 125 [I.V.:125] Out: 560 [Other:560]  General appearance: toxic, uncooperative and moaning , not coop, diffuse skin erosions on face and chest Neck: RIJ cath Resp: rales bilaterally and rhonchi bilaterally Cardio: regularly irregular rhythm and systolic murmur: holosystolic 2/6, blowing at lower left sternal border GI: tender, few bs, spleen palp, liver down 7 cm Extremities: edema 2+ and diffuse hemorr bullae, petech, spot on nails  Lab Results: Recent Labs    11/27/2017 0034 12/04/2017 0830  WBC 42.6* 47.0*  HGB 12.0* 10.8*  HCT 34.3* 32.2*  PLT 72* PENDING   BMET:  Recent Labs    12/03/2017 0034 12/09/2017 0830  NA 123* 126*  K 6.1* 6.1*  CL 90* 91*  CO2 9* 11*  GLUCOSE 89 106*  BUN 176* 173*  CREATININE 7.82* 7.03*  CALCIUM 6.9* 6.8*   No results for input(s): PTH in the last 72 hours. Iron Studies: No results for input(s): IRON, TIBC, TRANSFERRIN, FERRITIN in the last 72 hours.  Studies/Results: [place] Renal  Result Date: October 28, 2018 CLINICAL DATA:  Acute kidney injury. EXAM: RENAL / URINARY TRACT ULTRASOUND COMPLETE COMPARISON:  None. FINDINGS: Right Kidney: Length: 14.3. Increased echogenicity. Small volume of perinephric fluid identified. No mass or hydronephrosis visualized. Left Kidney: Length: 14 cm. Increased parenchymal echogenicity. No mass or hydronephrosis  visualized. Bladder: Appears normal for degree of bladder distention. IMPRESSION: 1. Bilateral echogenic kidneys compatible with chronic medical renal disease. 2. Small volume of right perinephric fluid. Electronically Signed   By: Signa Kellaylor  Stroud M.D.   On: 0December 03, 2019 21:46   Dg Chest Port 1 View  Result Date: 12/11/2017 CLINICAL DATA:  Shortness of Breath EXAM: PORTABLE CHEST 1 VIEW COMPARISON:  Study obtained earlier in the day FINDINGS: Central catheter tip in the superior cava. No pneumothorax. Areas of patchy airspace opacity noted in both mid and lower lung zones. There is stable cardiomegaly. Pulmonary vascularity is normal. No adenopathy. No evident bone lesions. IMPRESSION: Central catheter tip in superior vena cava. No pneumothorax. Airspace opacity bilaterally, likely multifocal pneumonia. Stable cardiac prominence. Electronically Signed   By: Bretta BangWilliam  Woodruff III M.D.   On: 12/09/2017 09:38   Dg Chest Port 1 View  Result Date: 12/06/2017 CLINICAL DATA:  32 year old male with central line placement. EXAM: PORTABLE CHEST 1 VIEW COMPARISON:  Chest radiograph dated 0December 03, 2019 FINDINGS: Right IJ central line with tip over central SVC.  No pneumothorax. Bilateral confluent airspace opacities with possible cystic or cavitary changes similar to prior radiograph. There is no pleural effusion. Stable cardiomegaly. No acute osseous pathology. IMPRESSION: 1. Interval placement of a right IJ central line with tip over central SVC. No pneumothorax. 2. Bilateral confluent airspace densities grossly similar to prior radiograph. 3. Cardiomegaly. Electronically Signed   By: Elgie CollardArash  Radparvar M.D.   On: 12/07/2017 05:53   Dg Chest Port 1 View  Result Date: October 28, 2018 CLINICAL  DATA:  Cough with chest pain EXAM: PORTABLE CHEST 1 VIEW COMPARISON:  CT chest 08/08/2017, radiograph 08/15/2017 FINDINGS: Multiple bilateral cystic and cavitary lung lesions, increased compared to the prior radiograph. Mild cardiomegaly.  No pleural effusion. No pneumothorax IMPRESSION: 1. Increased bilateral cystic and cavitary lesions compared to prior radiograph and CT 2. Cardiomegaly Electronically Signed   By: Jasmine Pang M.D.   On: 12/15/2017 17:48    I have reviewed the patient's current medications.  Assessment/Plan: 1 AKI/CKD4   Acidemic, hyperkalemic, uremic.  Vol xs. Severe elevation Uric acid, mild ^CK. Multifocal changes skin and CXR . will see how responds to CRRT . Immunologic studies pending 2 Sepsis severe, Staph, Gneg also 3 Endocarditis  4 substance abuse, ??withdrawal 5 thrombocytopenia P AB, CRRt, await studies.   LOS: 1 day   Fayrene Fearing [place] Svehla 12-21-17,11:32 AM

## 2017-12-27 NOTE — CV Procedure (Signed)
Attempted 2D Echo, could not perform due to restricted mobility, will attempt at a later time.  Erik [name]

## 2017-12-27 NOTE — ED Notes (Signed)
ED TO INPATIENT HANDOFF REPORT  Name/Age/Gender Erik [name] 32 y.o. male  Code Status    Code Status Orders  (From admission, onward)        Start     Ordered   12/10/2017 2350  Full code  Continuous     12/23/2017 2349    Code Status History    Date Active Date Inactive Code Status Order ID Comments User Context   07/13/2017 21:17 07/14/2017 13:42 Full Code [id number]  Vianne Bulls, MD ED   02/28/2015 03:39 02/28/2015 17:49 Full Code [id number]  Theressa Millard, MD Inpatient      Home/SNF/Other Home  Chief Complaint generalized pain  Level of Care/Admitting Diagnosis ED Disposition    ED Disposition Condition [facility] Area: [facility] [[id number]]  Level of Care: ICU [6]  Diagnosis: Septic shock [facility]) [[id number]]  Admitting Physician: [place], [place]  Attending Physician: Rigoberto Noel [[id number]]  Estimated length of stay: past midnight tomorrow  Certification:: I certify this patient will need inpatient services for at least 2 midnights  PT Class (Do Not Modify): Inpatient [101]  PT Acc Code (Do Not Modify): Private [1]       Medical History Past Medical History:  Diagnosis Date  . Anxiety   . Arthritis   . CHF (congestive heart failure) ([place])   . Depression   . GERD (gastroesophageal reflux disease)   . Hypertension   . Migraine   . Migraine   . Neck pain   . Renal disorder     Allergies Allergies  Allergen Reactions  . Codeine Nausea And Vomiting  . Levetiracetam Other (See Comments)    "said he became severely ill and angry mood swings" MD stopped medication  . Neurontin [Gabapentin] Other (See Comments)    "Feels out of it"  . Red Dye     Red dye #3  . Vicodin [Hydrocodone-Acetaminophen] Itching and Rash    IV Location/Drains/Wounds Patient Lines/Drains/Airways Status   Active Line/Drains/Airways    Name:   Placement date:   Placement time:   Site:   Days:   Peripheral IV 12/03/2017 Right;Upper Arm    12/24/2017    1748    Arm   1          Labs/Imaging Results for orders placed or performed during the hospital encounter of 12/11/2017 (from the past 48 hour(s))  Comprehensive metabolic panel     Status: Abnormal   Collection Time: 12/04/2017  5:13 PM  Result Value Ref Range   Sodium 122 (L) 135 - 145 mmol/L    Comment: REPEATED TO VERIFY   Potassium 5.7 (H) 3.5 - 5.1 mmol/L    Comment: REPEATED TO VERIFY   Chloride 85 (L) 101 - 111 mmol/L    Comment: REPEATED TO VERIFY   CO2 12 (L) 22 - 32 mmol/L    Comment: REPEATED TO VERIFY   Glucose, Bld 89 65 - 99 mg/dL   BUN 183 (H) 6 - 20 mg/dL    Comment: RESULTS CONFIRMED BY MANUAL DILUTION   Creatinine, Ser 8.01 (H) 0.61 - 1.24 mg/dL   Calcium 7.5 (L) 8.9 - 10.3 mg/dL    Comment: REPEATED TO VERIFY   Total Protein 7.4 6.5 - 8.1 g/dL   Albumin 2.1 (L) 3.5 - 5.0 g/dL   AST 356 (H) 15 - 41 U/L    Comment: RESULTS CONFIRMED BY MANUAL DILUTION   ALT 139 (H) 17 - 63 U/L  Alkaline Phosphatase 170 (H) 38 - 126 U/L   Total Bilirubin 3.8 (H) 0.3 - 1.2 mg/dL   GFR calc non Af Amer 8 (L) >60 mL/min   GFR calc Af Amer 9 (L) >60 mL/min    Comment: (NOTE) The eGFR has been calculated using the CKD EPI equation. This calculation has not been validated in all clinical situations. eGFR's persistently <60 mL/min signify possible Chronic Kidney Disease.    Anion gap 25 (H) 5 - 15  CBC WITH DIFFERENTIAL     Status: Abnormal   Collection Time: 12/16/2017  5:13 PM  Result Value Ref Range   WBC 47.5 (H) 4.0 - 10.5 K/uL   RBC 4.33 4.22 - 5.81 MIL/uL   Hemoglobin 12.2 (L) 13.0 - 17.0 g/dL   HCT 35.4 (L) 39.0 - 52.0 %   MCV 81.8 78.0 - 100.0 fL   MCH 28.2 26.0 - 34.0 pg   MCHC 34.5 30.0 - 36.0 g/dL   RDW 17.6 (H) 11.5 - 15.5 %   Platelets 71 (L) 150 - 400 K/uL    Comment: SPECIMEN CHECKED FOR CLOTS REPEATED TO VERIFY PLATELET COUNT CONFIRMED BY SMEAR    Neutrophils Relative % 93 %   Lymphocytes Relative 5 %   Monocytes Relative 2 %   Eosinophils  Relative 0 %   Basophils Relative 0 %   Neutro Abs 44.1 (H) 1.7 - 7.7 K/uL   Lymphs Abs 2.4 0.7 - 4.0 K/uL   Monocytes Absolute 1.0 0.1 - 1.0 K/uL   Eosinophils Absolute 0.0 0.0 - 0.7 K/uL   Basophils Absolute 0.0 0.0 - 0.1 K/uL   RBC Morphology POLYCHROMASIA PRESENT     Comment: RARE NRBCs   WBC Morphology MILD LEFT SHIFT (1-5% METAS, OCC MYELO, OCC BANDS)     Comment: TOXIC GRANULATION VACUOLATED NEUTROPHILS   Protime-INR     Status: Abnormal   Collection Time: 12/19/2017  5:13 PM  Result Value Ref Range   Prothrombin Time 27.9 (H) 11.4 - 15.2 seconds   INR 2.63   Type and screen [place]     Status: None   Collection Time: 12/20/2017  5:24 PM  Result Value Ref Range   ABO/RH(D) O POS    Antibody Screen NEG    Sample Expiration 12/16/2017   Brain natriuretic peptide     Status: Abnormal   Collection Time: 12/01/2017  5:28 PM  Result Value Ref Range   B Natriuretic Peptide 1,037.9 (H) 0.0 - 100.0 pg/mL  Acetaminophen level     Status: Abnormal   Collection Time: 11/30/2017  5:28 PM  Result Value Ref Range   Acetaminophen (Tylenol), Serum <10 (L) 10 - 30 ug/mL    Comment:        THERAPEUTIC CONCENTRATIONS VARY SIGNIFICANTLY. A RANGE OF 10-30 ug/mL MAY BE AN EFFECTIVE CONCENTRATION FOR MANY PATIENTS. HOWEVER, SOME ARE BEST TREATED AT CONCENTRATIONS OUTSIDE THIS RANGE. ACETAMINOPHEN CONCENTRATIONS >150 ug/mL AT 4 HOURS AFTER INGESTION AND >50 ug/mL AT 12 HOURS AFTER INGESTION ARE OFTEN ASSOCIATED WITH TOXIC REACTIONS.   Salicylate level     Status: None   Collection Time: 12/05/2017  5:28 PM  Result Value Ref Range   Salicylate Lvl <0.9 2.8 - 30.0 mg/dL  Ethanol     Status: None   Collection Time: 12/16/2017  5:28 PM  Result Value Ref Range   Alcohol, Ethyl (B) <10 <10 mg/dL    Comment:        LOWEST DETECTABLE LIMIT FOR SERUM  ALCOHOL IS 10 mg/dL FOR MEDICAL PURPOSES ONLY   Influenza panel by PCR (type A & B)     Status: None   Collection Time:  12/11/2017  5:30 PM  Result Value Ref Range   Influenza A By PCR NEGATIVE NEGATIVE   Influenza B By PCR NEGATIVE NEGATIVE    Comment: (NOTE) The Xpert Xpress Flu assay is intended as an aid in the diagnosis of  influenza and should not be used as a sole basis for treatment.  This  assay is FDA approved for nasopharyngeal swab specimens only. Nasal  washings and aspirates are unacceptable for Xpert Xpress Flu testing.   CK     Status: None   Collection Time: 12/12/2017  5:38 PM  Result Value Ref Range   Total CK 90 49 - 397 U/L  Fibrinogen     Status: None   Collection Time: 12/25/2017  5:38 PM  Result Value Ref Range   Fibrinogen 312 210 - 475 mg/dL  I-stat troponin, ED     Status: None   Collection Time: 12/26/2017  5:48 PM  Result Value Ref Range   Troponin i, poc 0.05 0.00 - 0.08 ng/mL   Comment 3            Comment: Due to the release kinetics of cTnI, a negative result within the first hours of the onset of symptoms does not rule out myocardial infarction with certainty. If myocardial infarction is still suspected, repeat the test at appropriate intervals.   I-Stat CG4 Lactic Acid, ED  (not at  [facility])     Status: Abnormal   Collection Time: 12/25/2017  5:50 PM  Result Value Ref Range   Lactic Acid, Venous 5.32 (HH) 0.5 - 1.9 mmol/L   Comment NOTIFIED PHYSICIAN   I-stat Chem 8, ED     Status: Abnormal   Collection Time: 12/12/2017  5:50 PM  Result Value Ref Range   Sodium 121 (L) 135 - 145 mmol/L   Potassium 5.6 (H) 3.5 - 5.1 mmol/L   Chloride 90 (L) 101 - 111 mmol/L   BUN >140 (H) 6 - 20 mg/dL   Creatinine, Ser 8.40 (H) 0.61 - 1.24 mg/dL   Glucose, Bld 94 65 - 99 mg/dL   Calcium, Ion 0.85 (LL) 1.15 - 1.40 mmol/L   TCO2 13 (L) 22 - 32 mmol/L   Hemoglobin 13.9 13.0 - 17.0 g/dL   HCT 41.0 39.0 - 52.0 %   Comment NOTIFIED PHYSICIAN   Lactate dehydrogenase     Status: Abnormal   Collection Time: 12/25/2017  6:16 PM  Result Value Ref Range   LDH 446 (H) 98 - 192 U/L  Uric acid      Status: Abnormal   Collection Time: 11/27/2017  6:16 PM  Result Value Ref Range   Uric Acid, Serum 20.7 (H) 4.4 - 7.6 mg/dL  CK     Status: None   Collection Time: 2017-12-23 12:34 AM  Result Value Ref Range   Total CK 144 49 - 397 U/L  CBC     Status: Abnormal   Collection Time: 12/23/17 12:34 AM  Result Value Ref Range   WBC 42.6 (H) 4.0 - 10.5 K/uL   RBC 4.19 (L) 4.22 - 5.81 MIL/uL   Hemoglobin 12.0 (L) 13.0 - 17.0 g/dL   HCT 34.3 (L) 39.0 - 52.0 %   MCV 81.9 78.0 - 100.0 fL   MCH 28.6 26.0 - 34.0 pg   MCHC 35.0 30.0 - 36.0 g/dL  RDW 17.7 (H) 11.5 - 15.5 %   Platelets 72 (L) 150 - 400 K/uL    Comment: REPEATED TO VERIFY SPECIMEN CHECKED FOR CLOTS PLATELET COUNT CONFIRMED BY SMEAR   Comprehensive metabolic panel     Status: Abnormal   Collection Time: 2018/01/04 12:34 AM  Result Value Ref Range   Sodium 123 (L) 135 - 145 mmol/L   Potassium 6.1 (H) 3.5 - 5.1 mmol/L    Comment: SLIGHT HEMOLYSIS   Chloride 90 (L) 101 - 111 mmol/L   CO2 9 (L) 22 - 32 mmol/L   Glucose, Bld 89 65 - 99 mg/dL   BUN 176 (H) 6 - 20 mg/dL    Comment: RESULTS CONFIRMED BY MANUAL DILUTION   Creatinine, Ser 7.82 (H) 0.61 - 1.24 mg/dL   Calcium 6.9 (L) 8.9 - 10.3 mg/dL   Total Protein 7.0 6.5 - 8.1 g/dL   Albumin 2.1 (L) 3.5 - 5.0 g/dL   AST 369 (H) 15 - 41 U/L   ALT 144 (H) 17 - 63 U/L   Alkaline Phosphatase 181 (H) 38 - 126 U/L   Total Bilirubin 4.0 (H) 0.3 - 1.2 mg/dL   GFR calc non Af Amer 8 (L) >60 mL/min   GFR calc Af Amer 10 (L) >60 mL/min    Comment: (NOTE) The eGFR has been calculated using the CKD EPI equation. This calculation has not been validated in all clinical situations. eGFR's persistently <60 mL/min signify possible Chronic Kidney Disease.    Anion gap >20 (H) 5 - 15  Magnesium     Status: Abnormal   Collection Time: 2018/01/04 12:34 AM  Result Value Ref Range   Magnesium 3.1 (H) 1.7 - 2.4 mg/dL  Phosphorus     Status: Abnormal   Collection Time: 01/04/18 12:34 AM  Result  Value Ref Range   Phosphorus 15.4 (H) 2.5 - 4.6 mg/dL    Comment: RESULTS CONFIRMED BY MANUAL DILUTION ICTERUS AT THIS LEVEL MAY AFFECT RESULT   Lactic acid, plasma     Status: Abnormal   Collection Time: Jan 04, 2018 12:34 AM  Result Value Ref Range   Lactic Acid, Venous 5.9 (HH) 0.5 - 1.9 mmol/L    Comment: CRITICAL RESULT CALLED TO, READ BACK BY AND VERIFIED WITH: T DOSTER,RN 12/14/16 0118 RHOLMES   Procalcitonin     Status: None   Collection Time: 01/04/18 12:34 AM  Result Value Ref Range   Procalcitonin 17.40 ng/mL    Comment:        Interpretation: PCT >= 10 ng/mL: Important systemic inflammatory response, almost exclusively due to severe bacterial sepsis or septic shock. (NOTE)       Sepsis PCT Algorithm           Lower Respiratory Tract                                      Infection PCT Algorithm    ----------------------------     ----------------------------         PCT < 0.25 ng/mL                PCT < 0.10 ng/mL         Strongly encourage             Strongly discourage   discontinuation of antibiotics    initiation of antibiotics    ----------------------------     -----------------------------  PCT 0.25 - 0.50 ng/mL            PCT 0.10 - 0.25 ng/mL               OR       >80% decrease in PCT            Discourage initiation of                                            antibiotics      Encourage discontinuation           of antibiotics    ----------------------------     -----------------------------         PCT >= 0.50 ng/mL              PCT 0.26 - 0.50 ng/mL                AND       <80% decrease in PCT             Encourage initiation of                                             antibiotics       Encourage continuation           of antibiotics    ----------------------------     -----------------------------        PCT >= 0.50 ng/mL                  PCT > 0.50 ng/mL               AND         increase in PCT                  Strongly encourage                                       initiation of antibiotics    Strongly encourage escalation           of antibiotics                                     -----------------------------                                           PCT <= 0.25 ng/mL                                                 OR                                        > 80% decrease in PCT  Discontinue / Do not initiate                                             antibiotics   Rapid HIV screen (HIV 1/2 Ab+Ag)     Status: None   Collection Time: 12-22-2017 12:34 AM  Result Value Ref Range   HIV-1 P24 Antigen - HIV24 NON REACTIVE NON REACTIVE   HIV 1/2 Antibodies NON REACTIVE NON REACTIVE   Interpretation (HIV Ag Ab)      A non reactive test result means that HIV 1 or HIV 2 antibodies and HIV 1 p24 antigen were not detected in the specimen.  CBG monitoring, ED     Status: Abnormal   Collection Time: 2017-12-22  1:12 AM  Result Value Ref Range   Glucose-Capillary 100 (H) 65 - 99 mg/dL   US Renal  Result Date: 11/30/2017 CLINICAL DATA:  Acute kidney injury. EXAM: RENAL / URINARY TRACT ULTRASOUND COMPLETE COMPARISON:  None. FINDINGS: Right Kidney: Length: 14.3. Increased echogenicity. Small volume of perinephric fluid identified. No mass or hydronephrosis visualized. Left Kidney: Length: 14 cm. Increased parenchymal echogenicity. No mass or hydronephrosis visualized. Bladder: Appears normal for degree of bladder distention. IMPRESSION: 1. Bilateral echogenic kidneys compatible with chronic medical renal disease. 2. Small volume of right perinephric fluid. Electronically Signed   By: Kerby Moors M.D.   On: 12/21/2017 21:46   Dg Chest Port 1 View  Result Date: 12/18/2017 CLINICAL DATA:  Cough with chest pain EXAM: PORTABLE CHEST 1 VIEW COMPARISON:  CT chest 08/08/2017, radiograph 08/15/2017 FINDINGS: Multiple bilateral cystic and cavitary lung lesions, increased compared to the prior radiograph. Mild  cardiomegaly. No pleural effusion. No pneumothorax IMPRESSION: 1. Increased bilateral cystic and cavitary lesions compared to prior radiograph and CT 2. Cardiomegaly Electronically Signed   By: Donavan Foil M.D.   On: 12/02/2017 17:48    Pending Labs Unresulted Labs (From admission, onward)   Start     Ordered   2017-12-22 1600  Renal function panel (daily at 1600)  Daily at 1600,   R     12/08/2017 2006   22-Dec-2017 0500  Blood gas, arterial  Tomorrow morning,   R     12/11/2017 2349   Dec 22, 2017 0500  Comprehensive metabolic panel  Tomorrow morning,   R     12/16/2017 2006   22-Dec-2017 0500  Phosphorus  Tomorrow morning,   R     12/15/2017 2006   2017-12-22 0500  CBC  Tomorrow morning,   R     11/28/2017 2006   12-22-2017 0500  Differential  Tomorrow morning,   R     11/27/2017 2006   12/22/2017 0500  Renal function panel (daily at 0500)  Daily,   R     12/10/2017 2006   12/22/2017 0500  Magnesium  Daily,   R     12/03/2017 2006   12-22-17 0500  APTT  (heparin infusion)  Daily,   R    Comments:  While on heparin.    12/24/2017 2006   12/24/2017 2350  Lactic acid, plasma  STAT Now then every 3 hours,   R     12/10/2017 2349   12/12/2017 2004  Sodium, urine, random  Once,   R     12/08/2017 2006   12/20/2017 2004  Creatinine, urine, random  Once,   R  12/12/2017 2006   12/17/2017 2004  C3 complement  Once,   R     12/23/2017 2006   11/26/2017 2004  C4 complement  Once,   R     12/18/2017 2006   12/06/2017 2004  Haptoglobin  Once,   R     12/01/2017 2006   12/03/2017 2004  Hepatitis B surface antigen  Once,   R     12/06/2017 2006   12/20/2017 2004  Hepatitis B surface antibody  Once,   R     12/12/2017 2006   11/27/2017 2004  Hepatitis c antibody (reflex)  Once,   R     12/04/2017 2006   12/23/2017 2004  HIV antibody  Once,   R     12/24/2017 2006   11/29/2017 2004  Parathyroid hormone, intact (no Ca)  Once,   R     12/19/2017 2006   12/22/2017 2004  Urinalysis, Routine w reflex microscopic  Once,   R     12/23/2017 2006   12/04/2017 2004   Protein / creatinine ratio, urine  Once,   R     12/11/2017 2006   12/18/2017 1741  HIV antibody  Once,   STAT     11/26/2017 1740   12/09/2017 1741  RPR  STAT,   STAT     12/24/2017 1740   12/05/2017 1728  Urine rapid drug screen (hosp performed)  STAT,   STAT     12/21/2017 1727   12/19/2017 1722  Urine culture  STAT,   STAT     12/01/2017 1721   11/27/2017 1713  Blood Culture (routine x 2)  BLOOD CULTURE X 2,   STAT     11/27/2017 1714   12/03/2017 1713  Urinalysis, Routine w reflex microscopic  STAT,   STAT     12/07/2017 1714      Vitals/Pain Today's Vitals   01/09/2018 0018 01/09/18 0100 2018-01-09 0120 01/09/18 0122  BP: (!) 97/49 92/75 (!) 77/56 (!) 99/53  Pulse: 65 65 65 66  Resp: (!) 24 (!) 30 (!) 27 (!) 22  Temp:      TempSrc:      SpO2: 96% 95% 96% 96%  Weight:      Height:      PainSc:        Isolation Precautions Droplet precaution  Medications Medications  0.9 %  sodium chloride infusion (not administered)  insulin aspart (novoLOG) injection 0-9 Units (0 Units Subcutaneous Not Given Jan 09, 2018 0148)  heparin injection 5,000 Units (not administered)  pantoprazole (PROTONIX) injection 40 mg (not administered)  heparin injection 1,000-6,000 Units (not administered)  heparin 10,000 units/ 20 mL infusion syringe (not administered)  heparin bolus via infusion syringe 1,000 Units (not administered)  sodium chloride 0.9 % primer fluid for CRRT (not administered)  prismasol BGK 4/2.5 5,000 mL dialysis replacement fluid (not administered)  prismasol BGK 4/2.5 5,000 mL dialysis replacement fluid (not administered)  prismasol BGK 4/2.5 5,000 mL dialysis solution (not administered)  LORazepam (ATIVAN) injection 1 mg (not administered)  fentaNYL (SUBLIMAZE) injection 50 mcg (not administered)  sodium bicarbonate 150 mEq in dextrose 5 % 1,000 mL infusion (not administered)  sodium chloride 0.9 % bolus 1,000 mL (0 mLs Intravenous Stopped 12/05/2017 2205)    And  sodium chloride 0.9 % bolus 1,000 mL (0  mLs Intravenous Stopped 12/09/2017 1837)    And  sodium chloride 0.9 % bolus 1,000 mL (0 mLs Intravenous Stopped 12/17/2017 1838)  piperacillin-tazobactam (ZOSYN) IVPB 3.375  g (0 g Intravenous Stopped 11/29/2017 1848)  vancomycin (VANCOCIN) IVPB 1000 mg/200 mL premix (0 mg Intravenous Stopped 12/06/2017 1908)  dextrose 50 % solution 25 mL (25 mLs Intravenous Given 12/04/2017 1803)  vancomycin (VANCOCIN) 500 mg in sodium chloride 0.9 % 100 mL IVPB (0 mg Intravenous Stopped 12/17/2017 0147)    Mobility walks

## 2017-12-27 NOTE — Procedures (Signed)
Central Venous Catheter Insertion Procedure Note Erik [name] [id number] 03-Oct-1986  Procedure: Insertion of Central Venous Catheter Indications: renal replacement therapy  Procedure Details Consent: done under emergent circumstance Time Out: Verified patient identification, verified procedure, site/side was marked, verified correct patient position, special equipment/implants available, medications/allergies/relevent history reviewed, required imaging and test results available.  Performed  Maximum sterile technique was used including antiseptics, cap, gloves, gown, hand hygiene, mask and sheet. Skin prep: Chlorhexidine; local anesthetic administered A antimicrobial bonded/coated triple lumen catheter was placed in the right internal jugular vein using the Seldinger technique.  Evaluation Blood flow good Complications: No apparent complications Patient did tolerate procedure well. Chest X-ray ordered to verify placement.  CXR: pending  Right IJ Trialysis - length 15 placed.Erik [name].  Erik [name] 2017-11-27, 5:10 AM

## 2017-12-27 NOTE — Progress Notes (Signed)
   [facility]  - worsening mental status  - now hypotensive but really fluctuating blood pressure  - on CRRT but worsening K despire CRRT - unofficial echo report - non-existent TV, with massive vegetaton and severe RV failure - Code status: grandparents at bedside reporting full code based on admission to [facility] on vent 4-6 months ago when he survived   Recent Labs  Lab 12/08/2017 1713 12/01/2017 1750 05/25/2018 0034 05/25/2018 0830 05/25/2018 1540  NA 122* 121* 123* 126* 129*  K 5.7* 5.6* 6.1* 6.1* 7.0*  CL 85* 90* 90* 91* 96*  CO2 12*  --  9* 11* 15*  GLUCOSE 89 94 89 106* 64*  BUN 183* >140* 176* 173* 116*  CREATININE 8.01* 8.40* 7.82* 7.03* 4.75*  CALCIUM 7.5*  --  6.9* 6.8* 6.8*  MG  --   --  3.1* 2.9*  --   PHOS  --   --  15.4* 13.3* 9.1*   '  PLAN - discussed over phone (rremote location at this hour and could not be at bedside) with grandmom and their friend -> said un-survivable and recommended DNR/DNI but full medical care. Grandma started crying and  is processing.   - They initially wanted to wait till 11pm to make decision so his uncle a truck driver could come to bedside. I then called the uncle at 7:02 PM Carroll KindsBill Owens at  [phone] and spoke on the phone  - uncle says he wants to process the information and talk to family when he arrives at bedside at 1am or so and talk to family in AM but based on [place] experience insisting on full code   - will sign out to elink  Dr. Kalman ShanMurali Toshiyuki Fredell, M.D., F.C.C.P Pulmonary and Critical Care Medicine Staff Physician, [facility] Director - Interstitial Lung Disease  Program  Pulmonary Fibrosis [facility] [place], [place], [id number]  Pager: [phone], If no answer or between  15:00h - 7:00h: call [phone] Telephone: [phone]

## 2017-12-27 NOTE — Progress Notes (Signed)
  Echocardiogram 2D Echocardiogram has been performed.  Lary Eckardt T Zyden Suman 12/10/2017, 5:57 PM

## 2017-12-27 NOTE — Progress Notes (Signed)
[facility] ED CSW reviewed chart and noted pt was moved to 1234.  Pt will need a 1st exam/1st opinion by provider once pt is medically assessed on 2nd floor.  Please reconsult if future social work needs arise.  CSW signing off, as social work intervention is no longer needed.  Dorothe PeaJonathan F. Zayven Powe, LCSW, LCAS, CSI Clinical Social Worker Ph: [phone]

## 2017-12-27 NOTE — Progress Notes (Signed)
Renal panel results called to Dr Signe ColtUpton. Orders received.

## 2017-12-27 NOTE — Significant Event (Signed)
.. ..    Name: Erik [name] MRN: [id number] DOB: 1986-08-17    ADMISSION DATE:  12/10/2017 E-link called to page for intubation  Pt is known to me and I have reviewed recent labs and documentation in the EMR. 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  Grandparents and family friend are at bedside Uncle phoned in to Grandmother's phone. Mother is in a correctional facility - we were unable to reach her by phone  I explained that the patient is in critical condition and is unable to manage his secretions and has a change in mental status. I also discussed his severe meatbolic derangement from his renal failure contributing to his declining mental status. Despite our best efforts he continues to worsen. I discussed the risks/benefits of intubation and explained that while this would temporize his decline but it will not change his overall prognosis.] We also discussed other options including medical management but if his heart should stop we allow him to pass and also the option of making him comfortable and not proceeding with any other invasive procedures.  After much deliberation and phone calls to family.  His grandmother made the decision to make the patient DNR/DNI comfort care. We will start the patient on withdrawal protocol.    CC TIME: 45 mins Palliative Care and Pastoral Consulted FAMILY: at bedside    Signed Dr Newell CoralKristen Debbrah Sampedro Pulmonary Critical Care Locums  10-26-2018, 8:33 PM

## 2017-12-27 NOTE — ED Notes (Addendum)
Date and time results received: 12/23/2017 0112  Test:Lactic Acid Critical Value: 5.9 Name of Provider Notified: [facility] mid level-page sent  Orders Received? Or Actions Taken?:

## 2017-12-27 NOTE — Accreditation Note (Signed)
Restraints not reported to CMS Pursuant to regulation 482.13(G)(3) use of soft wrist restraints was logged on 12/27/2017

## 2017-12-27 DEATH — deceased

## 2018-05-09 IMAGING — US US RENAL
1 series · 14 of 25 positions shown · non-contrast
Comparison: None.

CLINICAL DATA: Acute kidney injury.

EXAM:
RENAL / URINARY TRACT ULTRASOUND COMPLETE

[Series 1: us renal · 0.30mm/px · 14 of 32 slices shown]
[im 1/32]
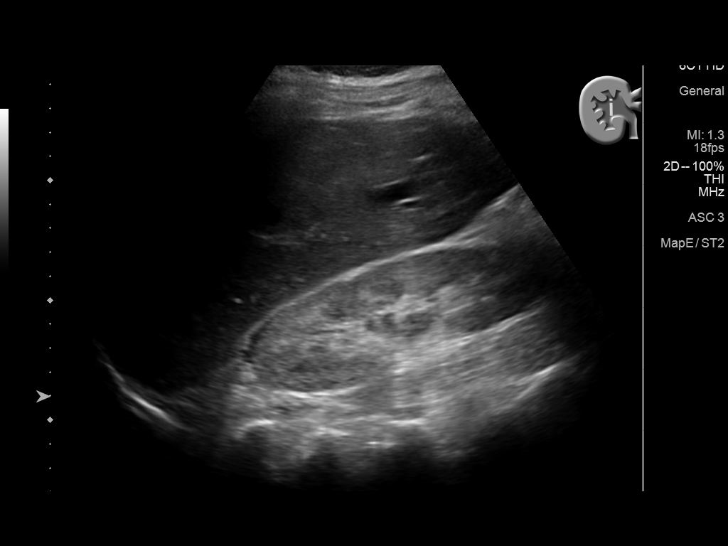
[im 3/32]
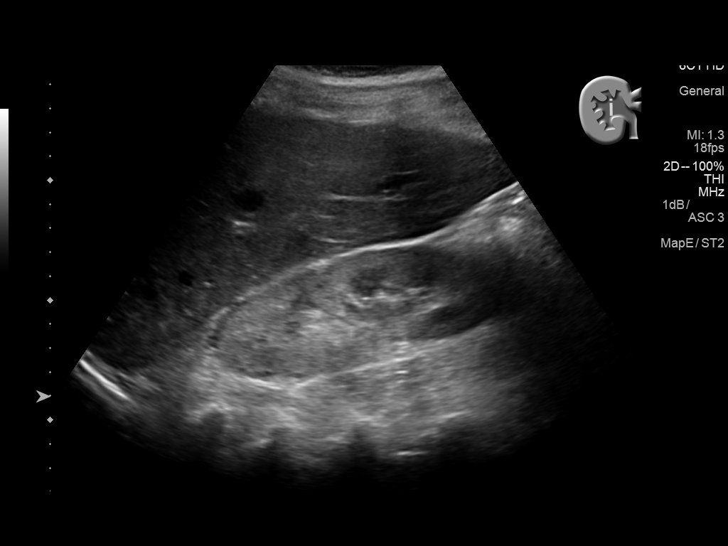
[im 6/32]
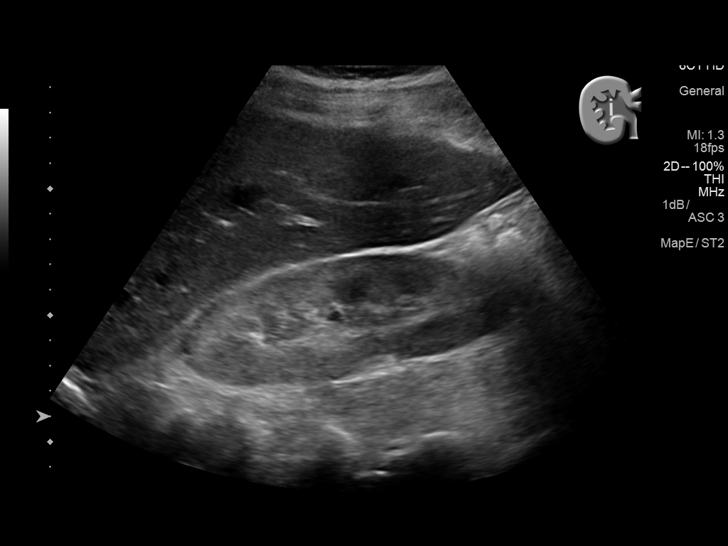
[im 8/32]
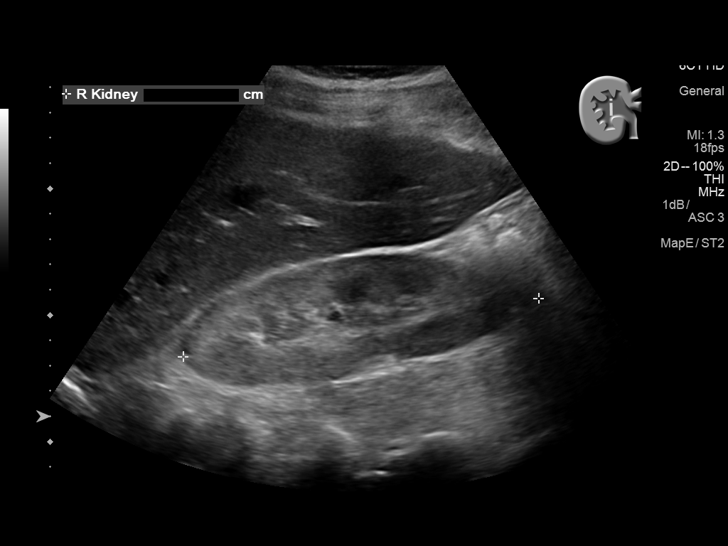
[im 11/32]
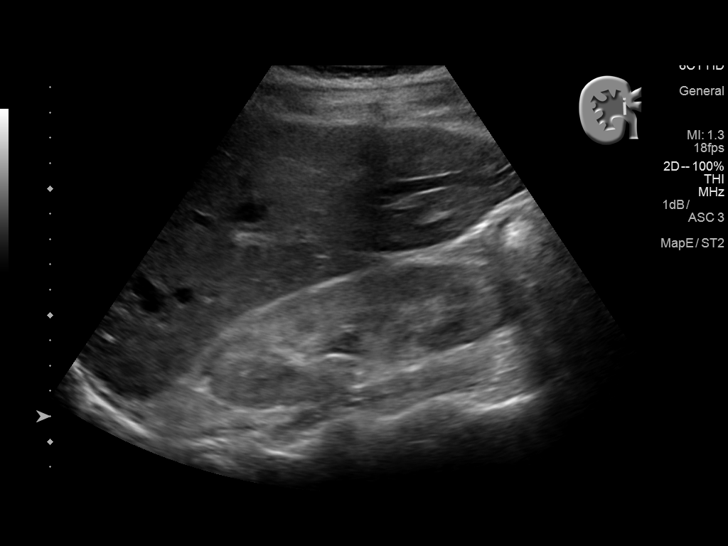
[im 12/32]
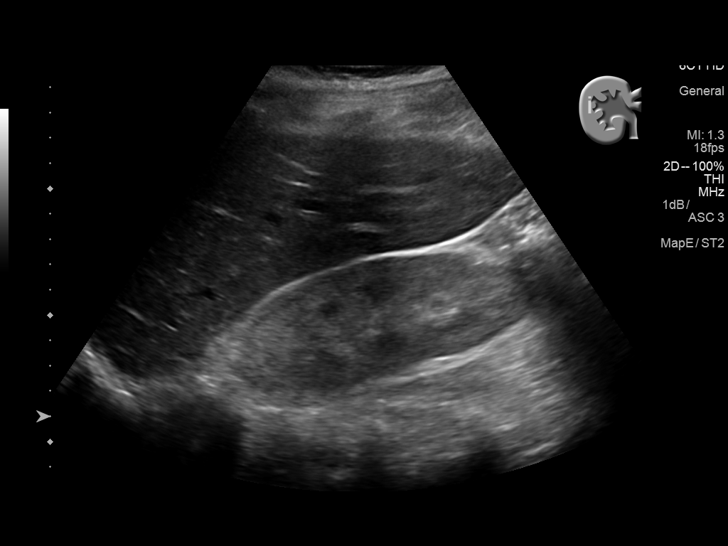
[im 15/32]
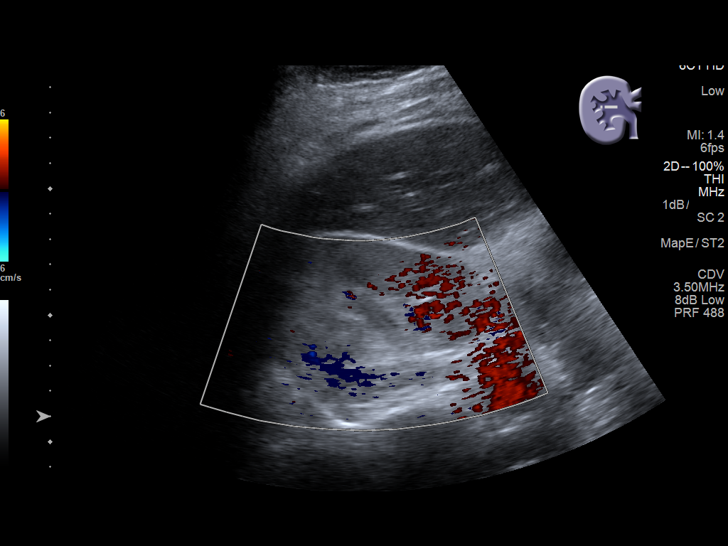
[im 17/32]
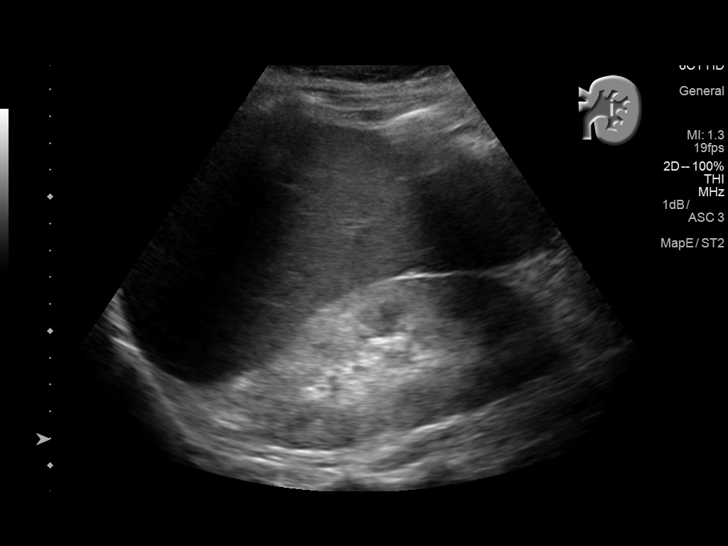
[im 20/32]
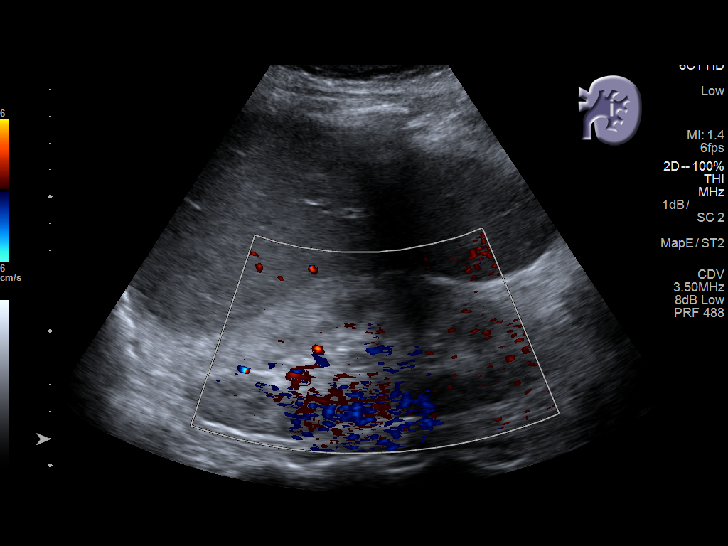
[im 21/32]
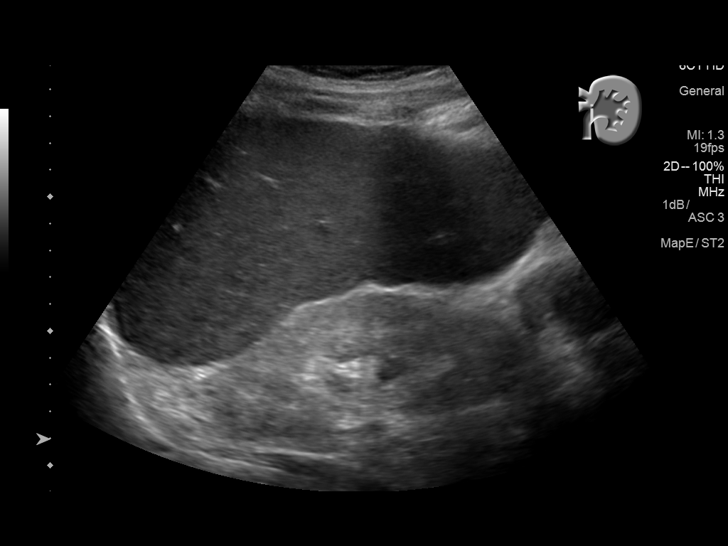
[im 24/32]
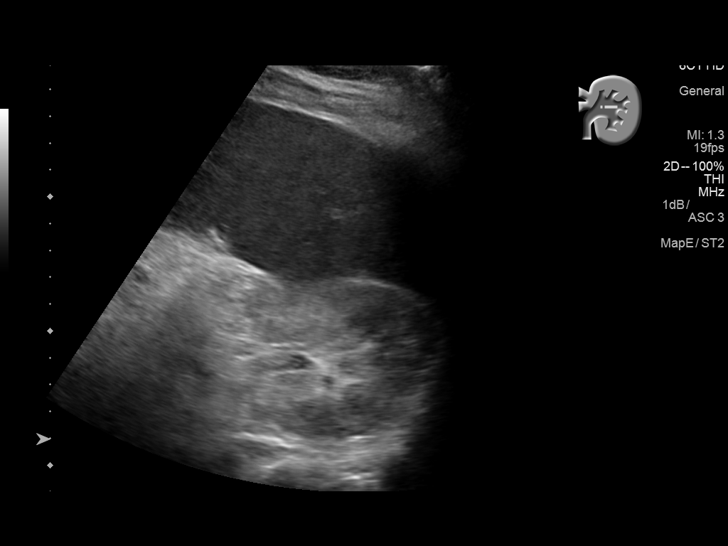
[im 26/32]
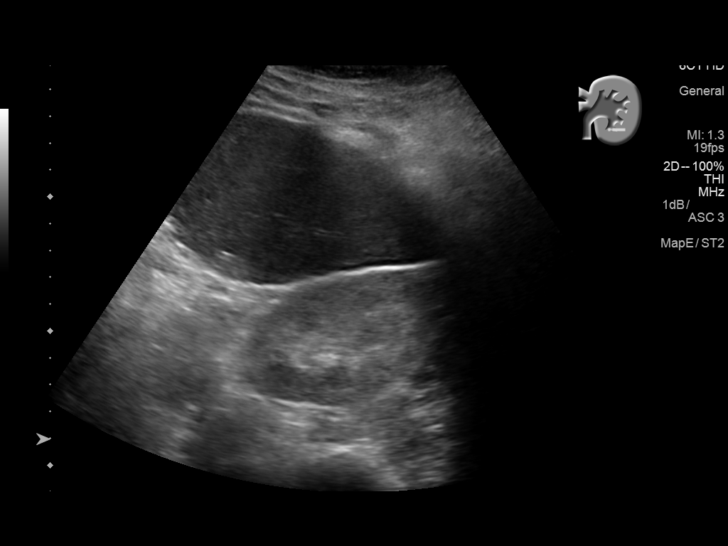
[im 29/32]
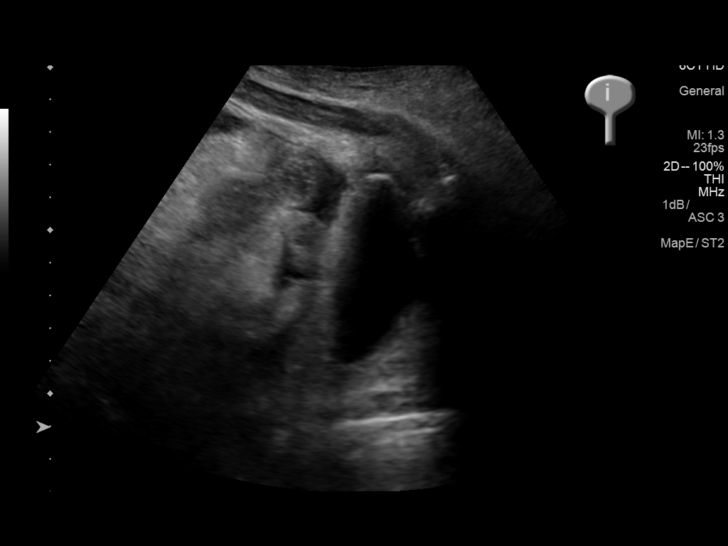
[im 32/32]
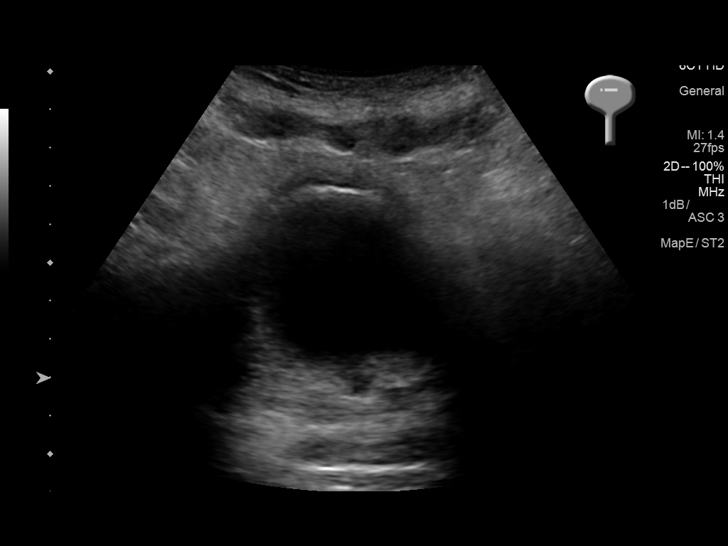

[14 of 25 positions shown; findings below may reference images not displayed]

FINDINGS: Right Kidney:

Length: 14.3. Increased echogenicity. Small volume of perinephric
fluid identified. No mass or hydronephrosis visualized.

Left Kidney:

Length: 14 cm. Increased parenchymal echogenicity. No mass or
hydronephrosis visualized.

Bladder:

Appears normal for degree of bladder distention.
IMPRESSION: 1. Bilateral echogenic kidneys compatible with chronic medical renal
disease.
2. Small volume of right perinephric fluid.

## 2019-07-30 IMAGING — DX DG CHEST 1V PORT
1 series · 1 of 1 positions shown · non-contrast
Comparison: Chest radiograph dated 12/13/2017

CLINICAL DATA: 31-year-old male with central line placement.

EXAM:
PORTABLE CHEST 1 VIEW

[chest ap]
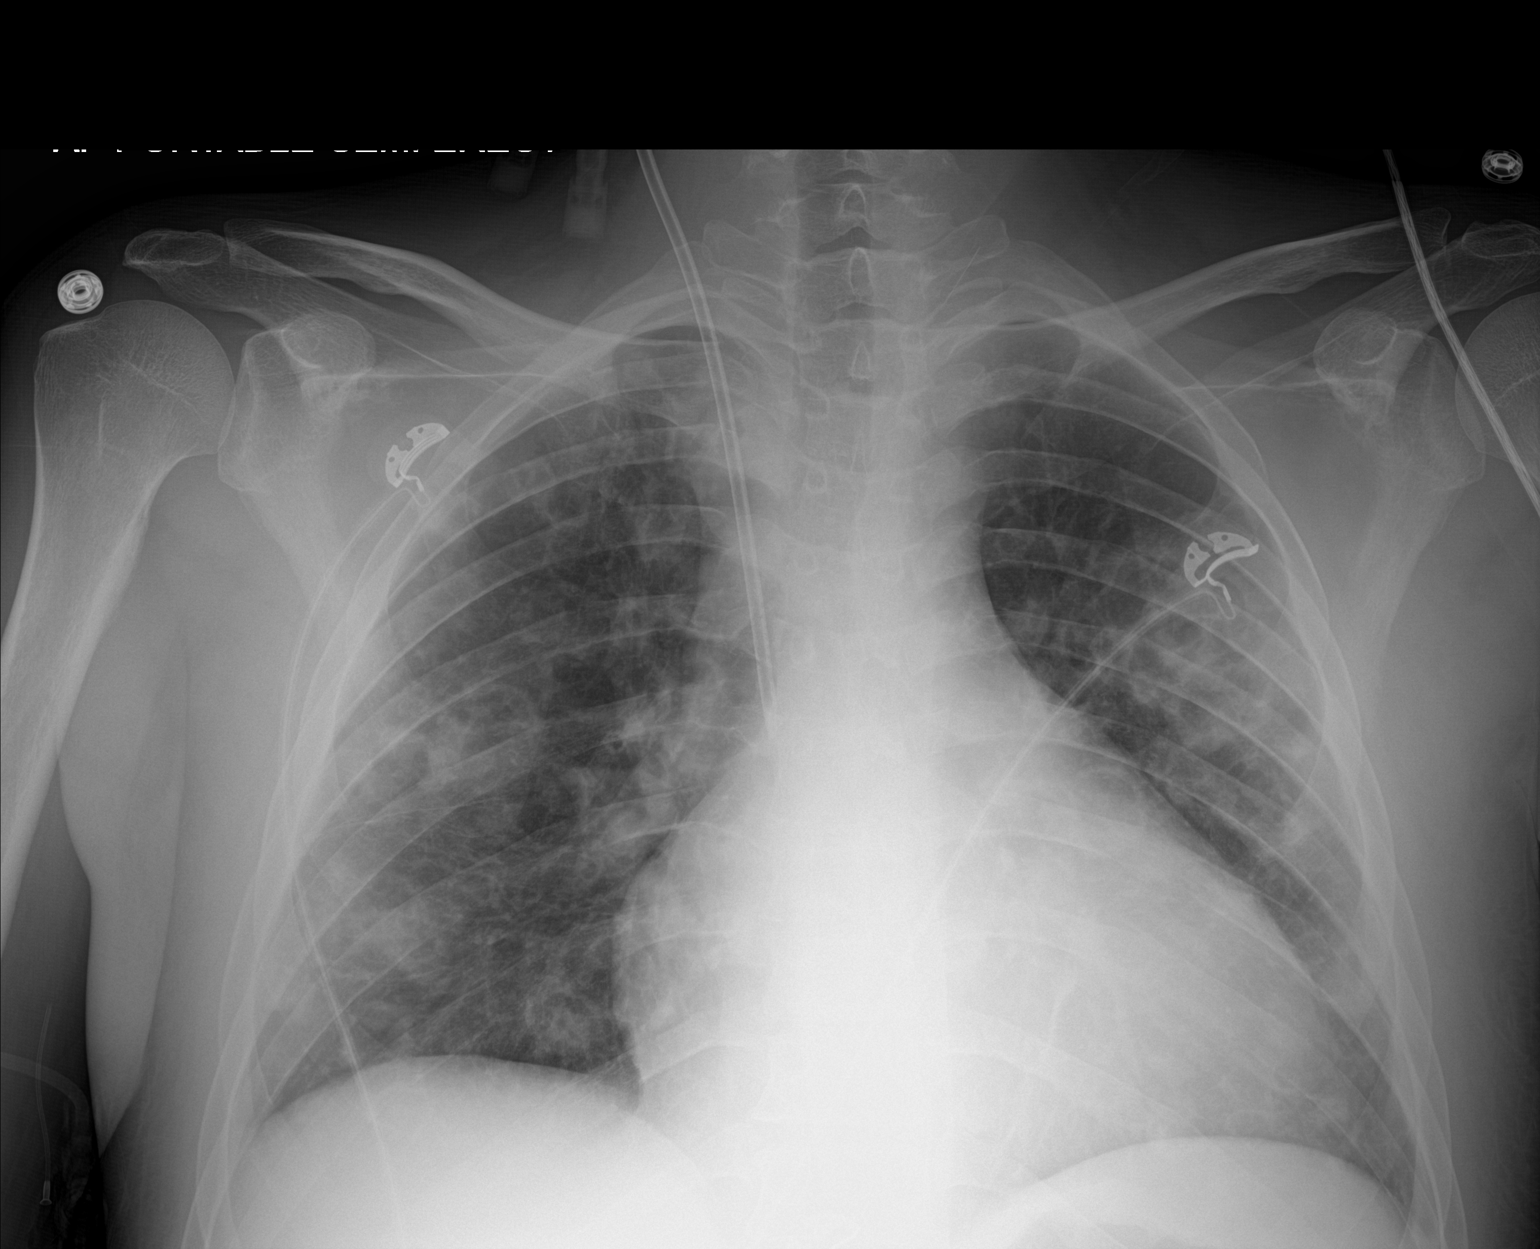

[1 of 1 positions shown; findings below may reference images not displayed]

FINDINGS: Right IJ central line with tip over central SVC.  No pneumothorax.

Bilateral confluent airspace opacities with possible cystic or
cavitary changes similar to prior radiograph. There is no pleural
effusion. Stable cardiomegaly. No acute osseous pathology.
IMPRESSION: 1. Interval placement of a right IJ central line with tip over
central SVC. No pneumothorax.
2. Bilateral confluent airspace densities grossly similar to prior
radiograph.
3. Cardiomegaly.
